# Patient Record
Sex: Female | Born: 1960
Health system: Southern US, Community
[De-identification: ages and names within clinical notes are randomized; demographics above are authoritative.]

## PROBLEM LIST (undated history)

## (undated) DIAGNOSIS — M545 Low back pain: Secondary | ICD-10-CM

## (undated) DIAGNOSIS — I10 Essential (primary) hypertension: Secondary | ICD-10-CM

## (undated) DIAGNOSIS — E785 Hyperlipidemia, unspecified: Secondary | ICD-10-CM

## (undated) DIAGNOSIS — N393 Stress incontinence (female) (male): Secondary | ICD-10-CM

## (undated) DIAGNOSIS — F32A Depression, unspecified: Secondary | ICD-10-CM

## (undated) DIAGNOSIS — M62838 Other muscle spasm: Secondary | ICD-10-CM

## (undated) DIAGNOSIS — Z720 Tobacco use: Secondary | ICD-10-CM

## (undated) DIAGNOSIS — E782 Mixed hyperlipidemia: Secondary | ICD-10-CM

## (undated) DIAGNOSIS — R0600 Dyspnea, unspecified: Secondary | ICD-10-CM

## (undated) DIAGNOSIS — M109 Gout, unspecified: Secondary | ICD-10-CM

## (undated) DIAGNOSIS — M1990 Unspecified osteoarthritis, unspecified site: Secondary | ICD-10-CM

## (undated) DIAGNOSIS — J439 Emphysema, unspecified: Secondary | ICD-10-CM

## (undated) DIAGNOSIS — G8929 Other chronic pain: Secondary | ICD-10-CM

## (undated) DIAGNOSIS — C50919 Malignant neoplasm of unspecified site of unspecified female breast: Secondary | ICD-10-CM

## (undated) DIAGNOSIS — J449 Chronic obstructive pulmonary disease, unspecified: Secondary | ICD-10-CM

## (undated) DIAGNOSIS — R531 Weakness: Secondary | ICD-10-CM

## (undated) DIAGNOSIS — F329 Major depressive disorder, single episode, unspecified: Secondary | ICD-10-CM

## (undated) DIAGNOSIS — C4359 Malignant melanoma of other part of trunk: Secondary | ICD-10-CM

## (undated) DIAGNOSIS — C55 Malignant neoplasm of uterus, part unspecified: Secondary | ICD-10-CM

## (undated) DIAGNOSIS — F419 Anxiety disorder, unspecified: Secondary | ICD-10-CM

## (undated) HISTORY — DX: Essential (primary) hypertension: I10

## (undated) HISTORY — DX: Mixed hyperlipidemia: E78.2

## (undated) HISTORY — PX: VAGINAL HYSTERECTOMY: SUR661

## (undated) HISTORY — DX: Other chronic pain: G89.29

## (undated) HISTORY — PX: MELANOMA EXCISION: SHX5266

## (undated) NOTE — *Deleted (*Deleted)
Patient Care Team: Margaret Rutherford, NP as PCP - General (Adult Health Nurse Practitioner)  DIAGNOSIS: No diagnosis found.  SUMMARY OF ONCOLOGIC HISTORY: Oncology History  Breast cancer of upper-outer quadrant of left female breast (HCC)  01/02/2017 Mammogram   Diagnostic mammogram and US  IMPRESSION: 1. Findings highly suspicious for malignancy in the upper outer quadrant of the left breast, measuring 1.8 x 2.1 x 1.6 cm. Medially adjacent to this mass is a 6 mm oval nearly anechoic nodule which could be a cyst or tumor nodule   2. A suspicious left axillary lymph node. 3. Probable fibroadenoma upper-outer quadrant right breast at 10 o'clock. 4. Probably benign calcifications in the right breast.   01/30/2017 Initial Biopsy   Breast, left, needle core biopsy, upper outer 2:00 9 cm fn - INVASIVE DUCTAL CARCINOMA, SEE COMMENT. - DUCTAL CARCINOMA IN SITU. Microscopic Comment The carcinoma appears grade 2.   01/30/2017 Receptors her2   ER 20%+, weak staining, PR-, HER2-, Ki67 15%   03/30/2017 Surgery   Left mastectomy:  IDC 3.2 cm, DCIS, margins negative, 3/18 lymph nodes positive with focal extranodal extension, grade 2, ER 20%, PR 0%, HER-2 negative ratio 1.13, Ki-67 15% stage IIB Right mastectomy: Complex sclerosing lesion with UDH, PASH, 0/5 LN Neg    04/24/2017 Pathology Results   Mammaprint testing: High risk, 10 year risk of recurrence untreated 29%; 5 year distant metastases free interval 94.6% with came on hormonal therapy, basal type    Chemotherapy   Refused chemo   06/11/2017 -  Anti-estrogen oral therapy   Anastrozole 1 mg daily     CHIEF COMPLIANT: Follow-up of left breast cancer on anastrozole therapy  INTERVAL HISTORY: Margaret James is a 24 y.o. with above-mentioned history of high risk left breast cancer treated with the bilateral mastectomies and is currently on anastrozole therapy. I last saw her two years ago. She presents to the clinic today for  follow-up.   ALLERGIES:  is allergic to lyrica [pregabalin].  MEDICATIONS:  Current Outpatient Medications  Medication Sig Dispense Refill  . albuterol (PROVENTIL HFA;VENTOLIN HFA) 108 (90 Base) MCG/ACT inhaler Inhale 2 puffs into the lungs every 6 (six) hours as needed for wheezing or shortness of breath.    . ALPRAZolam (XANAX) 0.5 MG tablet Take 0.5 mg by mouth daily as needed for anxiety.    Marland Kitchen amLODipine (NORVASC) 5 MG tablet Take 1 tablet (5 mg total) by mouth daily. 30 tablet 0  . anastrozole (ARIMIDEX) 1 MG tablet Take 1 tablet (1 mg total) by mouth daily. 90 tablet 0  . atorvastatin (LIPITOR) 20 MG tablet Take 1 tablet (20 mg total) by mouth daily.    Marland Kitchen buPROPion (WELLBUTRIN SR) 200 MG 12 hr tablet Take 200 mg by mouth daily.    . cloNIDine (CATAPRES) 0.1 MG tablet     . DULoxetine (CYMBALTA) 30 MG capsule Take 30 mg by mouth daily.    Marland Kitchen oxyCODONE-acetaminophen (PERCOCET) 10-325 MG tablet     . Tiotropium Bromide-Olodaterol (STIOLTO RESPIMAT) 2.5-2.5 MCG/ACT AERS Inhale 2 puffs into the lungs daily.    Marland Kitchen tiZANidine (ZANAFLEX) 4 MG tablet Take 4 mg by mouth every 8 (eight) hours as needed for muscle spasms.      No current facility-administered medications for this visit.    PHYSICAL EXAMINATION: ECOG PERFORMANCE STATUS: {CHL ONC ECOG PS:[id number]}  There were no vitals filed for this visit. There were no vitals filed for this visit.  BREAST:*** No palpable masses or nodules in either  right or left breasts. No palpable axillary supraclavicular or infraclavicular adenopathy no breast tenderness or nipple discharge. (exam performed in the presence of a chaperone)  LABORATORY DATA:  I have reviewed the data as listed CMP Latest Ref Rng & Units 01/01/2019 07/31/2018 09/11/2017  Glucose 70 - 99 mg/dL - 161(W) 960  BUN 6 - 20 mg/dL - 10 45.4  Creatinine 0.98 - 1.00 mg/dL 1.19 1.47 1.0  Sodium 829 - 145 mmol/L - 139 139  Potassium 3.5 - 5.1 mmol/L - 4.0 4.3  Chloride 98 - 111  mmol/L - 102 -  CO2 22 - 32 mmol/L - 26 26  Calcium 8.9 - 10.3 mg/dL - 9.6 56.2  Total Protein 6.4 - 8.3 g/dL - - 8.2  Total Bilirubin 0.20 - 1.20 mg/dL - - 1.30  Alkaline Phos 40 - 150 U/L - - 66  AST 5 - 34 U/L - - 15  ALT 0 - 55 U/L - - 18    Lab Results  Component Value Date   WBC 7.2 07/31/2018   HGB 14.5 07/31/2018   HCT 44.7 07/31/2018   MCV 94.7 07/31/2018   PLT 326 07/31/2018   NEUTROABS 4.1 09/11/2017    ASSESSMENT & PLAN:  No problem-specific Assessment & Plan notes found for this encounter.    No orders of the defined types were placed in this encounter.  The patient has a good understanding of the overall plan. she agrees with it. she will call with any problems that may develop before the next visit here.  Total time spent: *** mins including face to face time and time spent for planning, charting and coordination of care  Serena Croissant, MD 08/16/2020  I, Kirt Boys Dorshimer, am acting as scribe for Dr. Serena Croissant.  {insert scribe attestation}

---
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
## 2002-01-31 ENCOUNTER — Encounter: Payer: Self-pay | Admitting: Emergency Medicine

## 2002-01-31 ENCOUNTER — Encounter: Admission: RE | Admit: 2002-01-31 | Discharge: 2002-01-31 | Payer: Self-pay | Admitting: Emergency Medicine

## 2002-02-10 ENCOUNTER — Encounter: Payer: Self-pay | Admitting: Emergency Medicine

## 2002-02-10 ENCOUNTER — Encounter: Admission: RE | Admit: 2002-02-10 | Discharge: 2002-02-10 | Payer: Self-pay | Admitting: Emergency Medicine

## 2012-03-29 DIAGNOSIS — I2510 Atherosclerotic heart disease of native coronary artery without angina pectoris: Secondary | ICD-10-CM

## 2013-10-06 ENCOUNTER — Other Ambulatory Visit: Payer: Self-pay | Admitting: Sports Medicine

## 2013-10-06 DIAGNOSIS — IMO0002 Reserved for concepts with insufficient information to code with codable children: Secondary | ICD-10-CM

## 2013-10-16 ENCOUNTER — Ambulatory Visit
Admission: RE | Admit: 2013-10-16 | Discharge: 2013-10-16 | Disposition: A | Discharge status: Home / Self Care | Payer: 59 | Source: Ambulatory Visit | Attending: Sports Medicine | Admitting: Sports Medicine

## 2013-10-16 DIAGNOSIS — IMO0002 Reserved for concepts with insufficient information to code with codable children: Secondary | ICD-10-CM

## 2013-10-28 ENCOUNTER — Other Ambulatory Visit: Payer: Self-pay | Admitting: Pain Medicine

## 2013-10-29 ENCOUNTER — Other Ambulatory Visit: Payer: Self-pay | Admitting: Pain Medicine

## 2013-10-29 DIAGNOSIS — M8580 Other specified disorders of bone density and structure, unspecified site: Secondary | ICD-10-CM

## 2013-12-08 ENCOUNTER — Other Ambulatory Visit: Payer: 59

## 2013-12-31 ENCOUNTER — Other Ambulatory Visit: Payer: 59

## 2014-12-29 ENCOUNTER — Other Ambulatory Visit (HOSPITAL_COMMUNITY): Payer: Self-pay | Admitting: Respiratory Therapy

## 2014-12-29 DIAGNOSIS — R0602 Shortness of breath: Secondary | ICD-10-CM

## 2015-01-06 ENCOUNTER — Ambulatory Visit (HOSPITAL_COMMUNITY)
Admission: RE | Admit: 2015-01-06 | Discharge: 2015-01-06 | Disposition: A | Discharge status: Home / Self Care | Payer: 59 | Source: Ambulatory Visit | Attending: Internal Medicine | Admitting: Internal Medicine

## 2015-01-06 DIAGNOSIS — R0602 Shortness of breath: Secondary | ICD-10-CM | POA: Diagnosis present

## 2015-01-06 MED ORDER — ALBUTEROL SULFATE (2.5 MG/3ML) 0.083% IN NEBU
2.5000 mg | INHALATION_SOLUTION | Freq: Once | RESPIRATORY_TRACT | Status: AC
  Administered 2015-01-06: 2.5 mg via RESPIRATORY_TRACT

## 2015-01-12 LAB — PULMONARY FUNCTION TEST
DL/VA % pred: 51 %
DL/VA: 2.56 ml/min/mmHg/L
DLCO cor % pred: 32 %
DLCO cor: 8.4 ml/min/mmHg
DLCO unc % pred: 32 %
DLCO unc: 8.4 ml/min/mmHg
FEF 25-75 Post: 0.8 L/s
FEF 25-75 Pre: 0.83 L/s
FEF2575-%Change-Post: -3 %
FEF2575-%Pred-Post: 30 %
FEF2575-%Pred-Pre: 31 %
FEV1-%Change-Post: -1 %
FEV1-%Pred-Post: 63 %
FEV1-%Pred-Pre: 65 %
FEV1-Post: 1.79 L
FEV1-Pre: 1.82 L
FEV1FVC-%Change-Post: 0 %
FEV1FVC-%Pred-Pre: 77 %
FEV6-%Change-Post: 0 %
FEV6-%Pred-Post: 83 %
FEV6-%Pred-Pre: 83 %
FEV6-Post: 2.88 L
FEV6-Pre: 2.88 L
FEV6FVC-%Change-Post: 1 %
FEV6FVC-%Pred-Post: 101 %
FEV6FVC-%Pred-Pre: 100 %
FVC-%Change-Post: -1 %
FVC-%Pred-Post: 82 %
FVC-%Pred-Pre: 83 %
FVC-Post: 2.93 L
FVC-Pre: 2.96 L
Post FEV1/FVC ratio: 61 %
Post FEV6/FVC ratio: 98 %
Pre FEV1/FVC ratio: 61 %
Pre FEV6/FVC Ratio: 97 %
RV % pred: 86 %
RV: 1.67 L
TLC % pred: 74 %
TLC: 3.88 L

## 2015-08-10 ENCOUNTER — Other Ambulatory Visit (HOSPITAL_COMMUNITY): Payer: Self-pay | Admitting: Internal Medicine

## 2015-08-10 DIAGNOSIS — N63 Unspecified lump in unspecified breast: Secondary | ICD-10-CM

## 2015-08-24 ENCOUNTER — Inpatient Hospital Stay (HOSPITAL_COMMUNITY): Admission: RE | Admit: 2015-08-24 | Payer: 59 | Source: Ambulatory Visit

## 2015-09-13 ENCOUNTER — Other Ambulatory Visit (HOSPITAL_COMMUNITY): Payer: Self-pay | Admitting: Internal Medicine

## 2015-09-13 DIAGNOSIS — R222 Localized swelling, mass and lump, trunk: Secondary | ICD-10-CM

## 2015-09-15 ENCOUNTER — Ambulatory Visit (HOSPITAL_COMMUNITY)
Admission: RE | Admit: 2015-09-15 | Discharge: 2015-09-15 | Disposition: A | Discharge status: Home / Self Care | Payer: 59 | Source: Ambulatory Visit | Attending: Internal Medicine | Admitting: Internal Medicine

## 2015-09-15 DIAGNOSIS — R222 Localized swelling, mass and lump, trunk: Secondary | ICD-10-CM | POA: Diagnosis not present

## 2015-09-15 DIAGNOSIS — N6489 Other specified disorders of breast: Secondary | ICD-10-CM | POA: Diagnosis not present

## 2015-09-15 MED ORDER — IOHEXOL 300 MG/ML  SOLN
100.0000 mL | Freq: Once | INTRAMUSCULAR | Status: AC | PRN
  Administered 2015-09-15: 75 mL via INTRAVENOUS

## 2015-09-28 ENCOUNTER — Ambulatory Visit (HOSPITAL_COMMUNITY): Payer: 59 | Attending: Sports Medicine | Admitting: Physical Therapy

## 2015-09-28 DIAGNOSIS — Z7409 Other reduced mobility: Secondary | ICD-10-CM

## 2015-09-28 DIAGNOSIS — R6889 Other general symptoms and signs: Secondary | ICD-10-CM | POA: Insufficient documentation

## 2015-09-28 DIAGNOSIS — R29898 Other symptoms and signs involving the musculoskeletal system: Secondary | ICD-10-CM | POA: Insufficient documentation

## 2015-09-28 DIAGNOSIS — M5440 Lumbago with sciatica, unspecified side: Secondary | ICD-10-CM | POA: Diagnosis not present

## 2015-09-28 DIAGNOSIS — M623 Immobility syndrome (paraplegic): Secondary | ICD-10-CM | POA: Insufficient documentation

## 2015-09-28 DIAGNOSIS — M2560 Stiffness of unspecified joint, not elsewhere classified: Secondary | ICD-10-CM

## 2015-09-28 NOTE — Therapy (Signed)
[place], [place], [address] Phone: [phone]   Fax:  [phone]  Physical Therapy Evaluation  Patient Details  Name: Margaret James MRN: [id number] Date of Birth: 12/02/1961 Referring Provider: Dr. Delilah Shan  Encounter Date: 09/28/2015      PT End of Session - 09/28/15 1148    Visit Number 1   Number of Visits 8   Date for PT Re-Evaluation 10/28/15   Authorization Type [facility]   Authorization - Visit Number 1   Authorization - Number of Visits 10   PT Start Time 3532   PT Stop Time 1150   PT Time Calculation (min) 45 min   Activity Tolerance Patient tolerated treatment well   Behavior During Therapy [facility] for tasks assessed/performed      No past medical history on file.  No past surgical history on file.  There were no vitals filed for this visit.  Visit Diagnosis:  Midline low back pain with sciatica, sciatica laterality unspecified  Activity intolerance  Leg weakness, bilateral  Stiffness due to immobility      Subjective Assessment - 09/28/15 1110    Subjective Ms. Wilborn has been having back pain for three years.  At this time she fell over a box and landed on her lower back.  She has been diagnosed with nerve damage.  She states that her pain is constant unless she takes medication.  The pain is bilateral and goes down both legs to the knee level.   She states she is limited in activity due to her COPD.    Pertinent History COPD,    How long can you sit comfortably? an hour    How long can you stand comfortably? fifteen minutes.    How long can you walk comfortably? walking hurts her knees more than her back and she is limited by her COPD    Patient Stated Goals Have less pain and be able to lift more    Currently in Pain? Yes   Pain Score 6    Pain Location Back   Pain Orientation Lower   Pain Descriptors / Indicators Aching   Pain Radiating Towards to knee level    Multiple Pain  Sites Yes   Pain Score 5   Pain Location Knee   Pain Orientation Right;Left   Pain Descriptors / Indicators Aching   Pain Type Chronic pain   Pain Onset More than a month ago   Pain Frequency Intermittent   Aggravating Factors  walking             [facility] PT Assessment - 09/28/15 0001    Assessment   Medical Diagnosis Low back pain   Referring Provider Dr. Delilah Shan   Onset Date/Surgical Date 09/15/15   Next MD Visit 10/27/2015   Prior Therapy none    Precautions   Precautions None   Restrictions   Weight Bearing Restrictions No   Balance Screen   Has the patient fallen in the past 6 months No   Has the patient had a decrease in activity level because of a fear of falling?  No   Is the patient reluctant to leave their home because of a fear of falling?  No   Home Environment   Living Environment Private residence   Prior Function   Level of Independence Independent with household mobility with device  uses cane due to knees not her back    Vocation Unemployed   Leisure watches grand child  Cognition   Overall Cognitive Status Within Functional Limits for tasks assessed   Observation/Other Assessments   Focus on Therapeutic Outcomes (FOTO)  37   Functional Tests   Functional tests Single leg stance;Sit to Stand   Single Leg Stance   Comments Lt 20" , RT 4 seconds    Sit to Stand   Comments able to complete 5 in 49.43   ROM / Strength   AROM / PROM / Strength AROM;Strength   AROM   AROM Assessment Site Lumbar   Lumbar Flexion 70 going down increases pain more than up    Lumbar Extension 40 reps no change   Strength   Strength Assessment Site Hip;Knee;Ankle   Right/Left Hip Right;Left   Right Hip Flexion 3-/5   Right Hip Extension 3/5   Right Hip ABduction 3+/5   Left Hip Flexion 3/5   Left Hip Extension 3/5   Left Hip ABduction 3-/5   Right/Left Knee Right;Left   Right Knee Flexion 3/5   Right Knee Extension 4/5   Left Knee Flexion 3/5   Left Knee  Extension 3+/5   Right/Left Ankle Right;Left   Right Ankle Dorsiflexion 4+/5   Left Ankle Dorsiflexion 4+/5                   [facility] Adult PT Treatment/Exercise - 09/28/15 0001    Exercises   Exercises Lumbar   Lumbar Exercises: Stretches   Active Hamstring Stretch 2 reps;30 seconds   Active Hamstring Stretch Limitations supine   Single Knee to Chest Stretch 2 reps;30 seconds   Lumbar Exercises: Supine   Ab Set 10 reps   Glut Set 10 reps   Other Supine Lumbar Exercises hip adduction isometric x 10                  PT Short Term Goals - 09/28/15 1156    PT SHORT TERM GOAL #1   Title I HEP   Time 1   Period Weeks   PT SHORT TERM GOAL #2   Title Pain level to decrease to no greater than 5/10   Time 2   Period Weeks   PT SHORT TERM GOAL #3   Title Pt strength to be improved 1/2 grade to allow pt to stand for 10 minutes without increased pain.   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 Hogston is a 54 yo female who has had LBP x 3 yrs with acute exacerbation in the past several months.  The patient has been referred to physical therapy to  attempt to decrease her pain and improve her functional tolerance.  Examination demonstrates decreased ROM, decreased balance, decreasd core and LE strength, decrased activity tolerance and increased pain.  Ms. Mallinger will benefit from skilled PT to address these issues improve her functional mobility and her quality of life.    Pt will benefit from skilled therapeutic intervention in order to improve on the following deficits Abnormal gait;Decreased activity tolerance;Decreased balance;Decreased range of motion;Decreased strength;Difficulty walking;Impaired flexibility;Pain   Rehab Potential Good   PT Frequency 2x / week   PT Duration 4 weeks   PT Treatment/Interventions ADLs/Self Care Home Management;Functional mobility training;Therapeutic activities;Therapeutic exercise;Balance training;Patient/family education;Manual techniques   PT Next Visit Plan Pt will benefit from core strengthening begin bent knee lift, bridge, clam and isometric hip flexion; progress to hip excursion, heel raises and functional squats.    PT Home Exercise Plan given    Consulted and Agree with Plan of Care Patient          G-Codes - October 11, 2015 1330    Functional Assessment Tool Used foto   Functional Limitation Other PT primary   Other PT Primary Current Status (H6808) At least 60 percent but less than 80 percent impaired, limited or restricted   Other PT Primary Goal Status (U1103) At least 40 percent but less than 60 percent impaired, limited or restricted       Problem List There are no active problems to display for this patient.   Rayetta Humphrey, PT CLT [phone] 2015-10-11, 1:35 PM  [place] [address] [place], [place], [address] Phone: [phone]   Fax:  [phone]  Name: Margaret James MRN: [id number] Date of Birth: [birth year]

## 2015-09-28 NOTE — Patient Instructions (Signed)
Isometric Abdominal    Lying on back with knees bent, tighten stomach by pressing elbows down. Hold _5___ seconds. Repeat ___5-10_ times per set. Do _1___ sets per session. Do __5__ sessions per day.  http://orth.exer.us/1086   Copyright  VHI. All rights reserved.  Isometric Gluteals    Tighten buttock muscles. Repeat __5-10__ times per set. Do _1___ sets per session. Do _5___ sessions per day.  http://orth.exer.us/1126   Copyright  VHI. All rights reserved.  Strengthening: Hip Adduction - Isometric    With ball or folded pillow between knees, squeeze knees together. Hold __3-5__ seconds. Repeat __5-10__ times per set. Do ___1_ sets per session. Do _5___ sessions per day.  http://orth.exer.us/612   Copyright  VHI. All rights reserved.  Hamstring Stretch: Active    Support behind right knee. Starting with knee bent, attempt to straighten knee until a comfortable stretch is felt in back of thigh. Hold _30___ seconds. Repeat _3___ times per set. Do ___1_ sets per session. Do __2-3__ sessions per day.  http://orth.exer.us/158   Copyright  VHI. All rights reserved.  Knee-to-Chest Stretch: Unilateral    With hand behind right knee, pull knee in to chest until a comfortable stretch is felt in lower back and buttocks. Keep back relaxed. Hold _30___ seconds. Repeat _2-3___ times per set. Do _1___ sets per session. Do _2___ sessions per day.  http://orth.exer.us/126   Copyright  VHI. All rights reserved.

## 2015-09-30 ENCOUNTER — Ambulatory Visit (HOSPITAL_COMMUNITY): Payer: 59 | Admitting: Physical Therapy

## 2015-10-05 ENCOUNTER — Ambulatory Visit (HOSPITAL_COMMUNITY): Payer: 59 | Admitting: Physical Therapy

## 2015-10-05 DIAGNOSIS — M2560 Stiffness of unspecified joint, not elsewhere classified: Secondary | ICD-10-CM

## 2015-10-05 DIAGNOSIS — R29898 Other symptoms and signs involving the musculoskeletal system: Secondary | ICD-10-CM

## 2015-10-05 DIAGNOSIS — M5440 Lumbago with sciatica, unspecified side: Secondary | ICD-10-CM | POA: Diagnosis not present

## 2015-10-05 DIAGNOSIS — Z7409 Other reduced mobility: Secondary | ICD-10-CM

## 2015-10-05 DIAGNOSIS — R6889 Other general symptoms and signs: Secondary | ICD-10-CM

## 2015-10-05 NOTE — Therapy (Addendum)
[place] [address] [place], [place], [address] Phone: [phone]   Fax:  [phone]  Physical Therapy Treatment  Patient Details  Name: Margaret James MRN: [id number] Date of Birth: [birth year] Referring Provider: Dr. Delilah Shan  Encounter Date: 10/05/2015      PT End of Session - 10/05/15 1100    Visit Number 2   Number of Visits 8   Date for PT Re-Evaluation 10/28/15   Authorization Type [facility]   Authorization - Visit Number 2   Authorization - Number of Visits 8   PT Start Time 9563 Pt late for appointment time.    PT Stop Time 1105   PT Time Calculation (min) 35 min      No past medical history on file.  No past surgical history on file.  There were no vitals filed for this visit.  Visit Diagnosis:  Midline low back pain with sciatica, sciatica laterality unspecified  Activity intolerance  Leg weakness, bilateral  Stiffness due to immobility      Subjective Assessment - 10/05/15 1032    Subjective Margaret James has been having an acute exacerbation of her COPD and is SOB    Pertinent History COPD,    Currently in Pain? Yes   Pain Score 7    Pain Location Back   Pain Orientation Lower   Pain Descriptors / Indicators Aching               [facility] Adult PT Treatment/Exercise - 10/05/15 0001    Exercises   Exercises Lumbar   Lumbar Exercises: Standing   Heel Raises 10 reps   Functional Squats 10 reps   Other Standing Lumbar Exercises 3-D hip excursion x 3    Lumbar Exercises: Seated   Sit to Stand 5 reps   Other Seated Lumbar Exercises adduction squeeze; hip IR/ER isometrics using toe squeeze;heel squeeze x 10 , ab isometric x 10    Lumbar Exercises: Supine   Bridge 5 reps   Lumbar Exercises: Quadruped   Madcat/Old Horse 5 reps      HMP to low back at end of session            PT Education - 10/05/15 1059    Education provided Yes   Education Details use Heat for pain releif    Person(s) Educated Patient   Methods Explanation   Comprehension Verbalized understanding          PT Short Term Goals - 10/05/15 1101    PT SHORT TERM GOAL #1   Title I HEP   Period Weeks   Status On-going   PT SHORT TERM GOAL #2   Title Pain level to decrease to no greater than 5/10   Time 2   Period Weeks   Status On-going   PT SHORT TERM GOAL #3   Title Pt strength to be improved 1/2 grade to allow pt to stand for 10 minutes without increased pain.   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 Pt stopped therapist prior to 90/90 position( as sitting in chair).  Pt states she has to put her hands under her hips while supine due to increased pain. Treatment limited by pain.     Pt will benefit from skilled therapeutic intervention in order to improve on the following deficits Abnormal gait;Decreased activity tolerance;Decreased balance;Decreased range of motion;Decreased strength;Difficulty walking;Impaired flexibility;Pain   PT Frequency 2x / week   PT Duration 4 weeks   PT  Treatment/Interventions ADLs/Self Care Home Management;Functional mobility training;Therapeutic activities;Therapeutic exercise;Balance training;Patient/family education;Manual techniques   PT Next Visit Plan continue to progress exercises as able.          Problem List There are no active problems to display for this patient.   Rayetta Humphrey, PT CLT [phone] 10/05/2015, 11:06 AM  [place] [address] [place], [place], [address] Phone: [phone]   Fax:  [phone]  Name: Margaret James MRN: [id number] Date of Birth: June 07, 1961    PHYSICAL THERAPY DISCHARGE SUMMARY  Visits from Start of Care: 2  Current functional level related to goals / functional outcomes: sane   Remaining deficits: same   Education / Equipment: HEP  Plan: Patient agrees to discharge.  Patient goals were not met. Patient is being discharged due to not returning since the last visit.  ?????     \  Rayetta Humphrey, PT CLT [phone]

## 2015-10-07 ENCOUNTER — Ambulatory Visit (HOSPITAL_COMMUNITY): Payer: 59 | Admitting: Physical Therapy

## 2015-10-12 ENCOUNTER — Ambulatory Visit (HOSPITAL_COMMUNITY): Payer: 59 | Admitting: Physical Therapy

## 2015-10-12 ENCOUNTER — Telehealth (HOSPITAL_COMMUNITY): Payer: Self-pay

## 2015-10-12 NOTE — Telephone Encounter (Signed)
No transportation °

## 2015-10-14 ENCOUNTER — Ambulatory Visit (HOSPITAL_COMMUNITY): Payer: 59 | Attending: Sports Medicine | Admitting: Physical Therapy

## 2015-10-14 ENCOUNTER — Telehealth (HOSPITAL_COMMUNITY): Payer: Self-pay | Admitting: Physical Therapy

## 2015-10-14 DIAGNOSIS — M623 Immobility syndrome (paraplegic): Secondary | ICD-10-CM | POA: Insufficient documentation

## 2015-10-14 DIAGNOSIS — R29898 Other symptoms and signs involving the musculoskeletal system: Secondary | ICD-10-CM | POA: Insufficient documentation

## 2015-10-14 DIAGNOSIS — M5440 Lumbago with sciatica, unspecified side: Secondary | ICD-10-CM | POA: Insufficient documentation

## 2015-10-14 DIAGNOSIS — R6889 Other general symptoms and signs: Secondary | ICD-10-CM | POA: Insufficient documentation

## 2015-10-14 NOTE — Telephone Encounter (Signed)
Left message on VM regarding missed appointment.  This is patients second NS

## 2015-10-19 ENCOUNTER — Ambulatory Visit (HOSPITAL_COMMUNITY): Payer: 59

## 2015-10-19 DIAGNOSIS — R6889 Other general symptoms and signs: Secondary | ICD-10-CM

## 2015-10-19 DIAGNOSIS — M623 Immobility syndrome (paraplegic): Secondary | ICD-10-CM | POA: Diagnosis present

## 2015-10-19 DIAGNOSIS — M5440 Lumbago with sciatica, unspecified side: Secondary | ICD-10-CM

## 2015-10-19 DIAGNOSIS — R29898 Other symptoms and signs involving the musculoskeletal system: Secondary | ICD-10-CM | POA: Diagnosis present

## 2015-10-19 DIAGNOSIS — M2560 Stiffness of unspecified joint, not elsewhere classified: Secondary | ICD-10-CM

## 2015-10-19 DIAGNOSIS — Z7409 Other reduced mobility: Secondary | ICD-10-CM

## 2015-10-19 NOTE — Therapy (Addendum)
[place], [place], [address] Phone: [phone]   Fax:  [phone]  Physical Therapy Treatment  Patient Details  Name: Margaret James MRN: [id number] Date of Birth: January 26, 1961 Referring Provider: Dr. Delilah Shan  Encounter Date: 10/19/2015      PT End of Session - 10/19/15 1157    Visit Number 3   Number of Visits 8   Date for PT Re-Evaluation 10/28/15   Authorization Type [facility]   Authorization - Visit Number 3   Authorization - Number of Visits 8   PT Start Time 1104   PT Stop Time 1150   PT Time Calculation (min) 46 min   Activity Tolerance Patient tolerated treatment well   Behavior During Therapy [facility] for tasks assessed/performed      No past medical history on file.  No past surgical history on file.  There were no vitals filed for this visit.  Visit Diagnosis:  Midline low back pain with sciatica, sciatica laterality unspecified  Activity intolerance  Leg weakness, bilateral  Stiffness due to immobility      Subjective Assessment - 10/19/15 1100    Subjective Pt stated her back pain scale is the same today 7/10, pain medication taken prior session today.   Pain Score 7    Pain Location Back   Pain Orientation Lower   Pain Descriptors / Indicators Nagging                         [facility] Adult PT Treatment/Exercise - 10/19/15 0001    Exercises   Exercises Lumbar   Lumbar Exercises: Stretches   Pelvic Tilt 5 reps;10 seconds   Pelvic Tilt Limitations posterior pelvic tilt with ab set   Prone on Elbows Stretch 2 reps;20 seconds   Lumbar Exercises: Standing   Heel Raises 10 reps   Functional Squats 10 reps   Other Standing Lumbar Exercises 3-D hip excursion x 10   Lumbar Exercises: Seated   Sit to Stand 5 reps   Other Seated Lumbar Exercises adduction squeeze; hip IR/ER isometrics using toe squeeze;heel squeeze x 10 , ab isometric x 10; posterior pelvic tilt with ab set  x 10   Lumbar Exercises: Supine   Glut Set 10 reps;5 seconds   Bridge 10 reps   Bridge Limitations semirecumbent position with hands under hips    Lumbar Exercises: Quadruped   Madcat/Old Horse 5 reps   Straight Leg Raise 5 reps;5 seconds   Straight Leg Raises Limitations heel squeeze   Modalities   Modalities Moist Heat   Moist Heat Therapy   Number Minutes Moist Heat 10 Minutes   Moist Heat Location Lumbar Spine  During seated exercises at EOS                  PT Short Term Goals - 10/05/15 1101    PT SHORT TERM GOAL #1   Title I HEP   Period Weeks   Status On-going   PT SHORT TERM GOAL #2   Title Pain level to decrease to no greater than 5/10   Time 2   Period Weeks   Status On-going   PT SHORT TERM GOAL #3   Title Pt strength to be improved 1/2 grade to allow pt to stand for 10 minutes without increased pain.   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  Session focus  on improving core and gluteal strengthening as well as awareness of posure.  Verbal and tactile cueing required to improve core activation due to weakness.  Pt was able to tolerate prone heel squeeze and minimal cueing required for form with squats this session.  Ended session with MHP to lower back during seated strengtheniung exercises.  Pt stated pain reduced at end of session.   PT Next Visit Plan continue to progress exercises as able.          Problem List There are no active problems to display for this patient.  [address] LPTA; [place]   Aldona Lento 10/19/2015, 12:03 PM  [place], [place], [address] Phone: [phone]   Fax:  [phone]  Name: Margaret James MRN: [id number] Date of Birth: 04/19/1961    PHYSICAL THERAPY DISCHARGE SUMMARY 02/01/2016 Visits from Start of Care: 3  Current functional level related to goals / functional outcomes: As above Remaining deficits: Pain mobility   Education / Equipment: HEP  Plan: Patient agrees to discharge.  Patient goals were not met. Patient is being discharged due to not returning since the last visit.  ?????  Rayetta Humphrey, [place] CLT [phone]

## 2015-10-21 ENCOUNTER — Telehealth (HOSPITAL_COMMUNITY): Payer: Self-pay | Admitting: Physical Therapy

## 2015-10-21 ENCOUNTER — Ambulatory Visit (HOSPITAL_COMMUNITY): Payer: 59 | Admitting: Physical Therapy

## 2015-10-21 NOTE — Telephone Encounter (Signed)
Pt NS for appointment.  Contacted patient who though today was Wednesday, not Thursday.  Pt reminded of next appointment

## 2015-10-26 ENCOUNTER — Ambulatory Visit (HOSPITAL_COMMUNITY): Payer: 59 | Admitting: Physical Therapy

## 2016-09-28 ENCOUNTER — Encounter: Payer: Self-pay | Admitting: Obstetrics and Gynecology

## 2016-10-23 ENCOUNTER — Encounter: Payer: Self-pay | Admitting: Obstetrics and Gynecology

## 2016-11-06 ENCOUNTER — Encounter: Payer: Self-pay | Admitting: Obstetrics and Gynecology

## 2016-12-08 ENCOUNTER — Ambulatory Visit (HOSPITAL_COMMUNITY)
Admission: RE | Admit: 2016-12-08 | Discharge: 2016-12-08 | Disposition: A | Discharge status: Home / Self Care | Payer: Managed Care, Other (non HMO) | Source: Ambulatory Visit | Attending: Pulmonary Disease | Admitting: Pulmonary Disease

## 2016-12-08 ENCOUNTER — Other Ambulatory Visit (HOSPITAL_COMMUNITY): Payer: Self-pay | Admitting: Internal Medicine

## 2016-12-08 ENCOUNTER — Other Ambulatory Visit (HOSPITAL_COMMUNITY): Payer: Self-pay | Admitting: Pulmonary Disease

## 2016-12-08 DIAGNOSIS — R05 Cough: Secondary | ICD-10-CM

## 2016-12-08 DIAGNOSIS — R059 Cough, unspecified: Secondary | ICD-10-CM

## 2016-12-08 DIAGNOSIS — R0602 Shortness of breath: Secondary | ICD-10-CM

## 2016-12-11 HISTORY — PX: BREAST BIOPSY: SHX20

## 2016-12-12 ENCOUNTER — Other Ambulatory Visit (HOSPITAL_COMMUNITY): Payer: Self-pay | Admitting: Internal Medicine

## 2016-12-12 DIAGNOSIS — N6320 Unspecified lump in the left breast, unspecified quadrant: Secondary | ICD-10-CM

## 2016-12-18 ENCOUNTER — Other Ambulatory Visit (HOSPITAL_COMMUNITY): Payer: Self-pay | Admitting: Internal Medicine

## 2016-12-18 DIAGNOSIS — N6320 Unspecified lump in the left breast, unspecified quadrant: Secondary | ICD-10-CM

## 2016-12-19 ENCOUNTER — Encounter (HOSPITAL_COMMUNITY): Payer: Self-pay

## 2017-01-02 ENCOUNTER — Other Ambulatory Visit (HOSPITAL_COMMUNITY): Payer: Self-pay | Admitting: Internal Medicine

## 2017-01-02 ENCOUNTER — Ambulatory Visit (HOSPITAL_COMMUNITY)
Admission: RE | Admit: 2017-01-02 | Discharge: 2017-01-02 | Disposition: A | Discharge status: Home / Self Care | Payer: Managed Care, Other (non HMO) | Source: Ambulatory Visit | Attending: Internal Medicine | Admitting: Internal Medicine

## 2017-01-02 DIAGNOSIS — N6320 Unspecified lump in the left breast, unspecified quadrant: Secondary | ICD-10-CM

## 2017-01-02 DIAGNOSIS — N6489 Other specified disorders of breast: Secondary | ICD-10-CM | POA: Diagnosis not present

## 2017-01-02 DIAGNOSIS — N6321 Unspecified lump in the left breast, upper outer quadrant: Secondary | ICD-10-CM | POA: Diagnosis present

## 2017-01-30 ENCOUNTER — Other Ambulatory Visit (HOSPITAL_COMMUNITY): Payer: Self-pay | Admitting: Internal Medicine

## 2017-01-30 ENCOUNTER — Ambulatory Visit (HOSPITAL_COMMUNITY)
Admission: RE | Admit: 2017-01-30 | Discharge: 2017-01-30 | Disposition: A | Discharge status: Home / Self Care | Payer: Managed Care, Other (non HMO) | Source: Ambulatory Visit | Attending: Internal Medicine | Admitting: Internal Medicine

## 2017-01-30 DIAGNOSIS — R921 Mammographic calcification found on diagnostic imaging of breast: Secondary | ICD-10-CM

## 2017-01-30 DIAGNOSIS — N6320 Unspecified lump in the left breast, unspecified quadrant: Secondary | ICD-10-CM | POA: Diagnosis present

## 2017-01-30 DIAGNOSIS — D0512 Intraductal carcinoma in situ of left breast: Secondary | ICD-10-CM | POA: Diagnosis not present

## 2017-01-30 DIAGNOSIS — Z09 Encounter for follow-up examination after completed treatment for conditions other than malignant neoplasm: Secondary | ICD-10-CM

## 2017-01-30 MED ORDER — LIDOCAINE-EPINEPHRINE (PF) 1 %-1:200000 IJ SOLN
INTRAMUSCULAR | Status: AC
  Filled 2017-01-30: qty 30

## 2017-01-30 MED ORDER — LIDOCAINE HCL (PF) 1 % IJ SOLN
INTRAMUSCULAR | Status: AC
  Filled 2017-01-30: qty 10

## 2017-02-26 ENCOUNTER — Other Ambulatory Visit (HOSPITAL_COMMUNITY): Payer: Self-pay | Admitting: Internal Medicine

## 2017-02-26 ENCOUNTER — Other Ambulatory Visit: Payer: Self-pay | Admitting: General Surgery

## 2017-02-26 DIAGNOSIS — N6320 Unspecified lump in the left breast, unspecified quadrant: Secondary | ICD-10-CM

## 2017-02-26 DIAGNOSIS — C50412 Malignant neoplasm of upper-outer quadrant of left female breast: Secondary | ICD-10-CM

## 2017-03-01 ENCOUNTER — Other Ambulatory Visit: Payer: 59

## 2017-03-05 ENCOUNTER — Other Ambulatory Visit: Payer: 59

## 2017-03-05 ENCOUNTER — Observation Stay (HOSPITAL_COMMUNITY)
Admission: EM | Admit: 2017-03-05 | Discharge: 2017-03-05 | Disposition: A | Discharge status: Home / Self Care | Payer: Managed Care, Other (non HMO) | Attending: Internal Medicine | Admitting: Internal Medicine

## 2017-03-05 ENCOUNTER — Observation Stay (HOSPITAL_BASED_OUTPATIENT_CLINIC_OR_DEPARTMENT_OTHER): Payer: Managed Care, Other (non HMO)

## 2017-03-05 ENCOUNTER — Encounter (HOSPITAL_COMMUNITY): Payer: Self-pay | Admitting: Emergency Medicine

## 2017-03-05 ENCOUNTER — Other Ambulatory Visit (HOSPITAL_COMMUNITY): Payer: 59

## 2017-03-05 ENCOUNTER — Emergency Department (HOSPITAL_COMMUNITY): Payer: Managed Care, Other (non HMO)

## 2017-03-05 DIAGNOSIS — R071 Chest pain on breathing: Secondary | ICD-10-CM | POA: Diagnosis not present

## 2017-03-05 DIAGNOSIS — I10 Essential (primary) hypertension: Secondary | ICD-10-CM | POA: Insufficient documentation

## 2017-03-05 DIAGNOSIS — Z79891 Long term (current) use of opiate analgesic: Secondary | ICD-10-CM | POA: Diagnosis not present

## 2017-03-05 DIAGNOSIS — C50412 Malignant neoplasm of upper-outer quadrant of left female breast: Secondary | ICD-10-CM | POA: Diagnosis present

## 2017-03-05 DIAGNOSIS — Z79899 Other long term (current) drug therapy: Secondary | ICD-10-CM | POA: Insufficient documentation

## 2017-03-05 DIAGNOSIS — D0512 Intraductal carcinoma in situ of left breast: Secondary | ICD-10-CM | POA: Insufficient documentation

## 2017-03-05 DIAGNOSIS — Z720 Tobacco use: Secondary | ICD-10-CM | POA: Diagnosis not present

## 2017-03-05 DIAGNOSIS — I249 Acute ischemic heart disease, unspecified: Secondary | ICD-10-CM

## 2017-03-05 DIAGNOSIS — F329 Major depressive disorder, single episode, unspecified: Secondary | ICD-10-CM | POA: Diagnosis not present

## 2017-03-05 DIAGNOSIS — C50919 Malignant neoplasm of unspecified site of unspecified female breast: Secondary | ICD-10-CM

## 2017-03-05 DIAGNOSIS — R079 Chest pain, unspecified: Secondary | ICD-10-CM

## 2017-03-05 DIAGNOSIS — E785 Hyperlipidemia, unspecified: Secondary | ICD-10-CM | POA: Diagnosis not present

## 2017-03-05 DIAGNOSIS — J449 Chronic obstructive pulmonary disease, unspecified: Secondary | ICD-10-CM | POA: Diagnosis not present

## 2017-03-05 DIAGNOSIS — F17210 Nicotine dependence, cigarettes, uncomplicated: Secondary | ICD-10-CM | POA: Diagnosis not present

## 2017-03-05 DIAGNOSIS — Z8249 Family history of ischemic heart disease and other diseases of the circulatory system: Secondary | ICD-10-CM | POA: Insufficient documentation

## 2017-03-05 DIAGNOSIS — F32A Depression, unspecified: Secondary | ICD-10-CM | POA: Diagnosis present

## 2017-03-05 DIAGNOSIS — J439 Emphysema, unspecified: Secondary | ICD-10-CM

## 2017-03-05 DIAGNOSIS — Z8542 Personal history of malignant neoplasm of other parts of uterus: Secondary | ICD-10-CM | POA: Insufficient documentation

## 2017-03-05 HISTORY — DX: Depression, unspecified: F32.A

## 2017-03-05 HISTORY — DX: Major depressive disorder, single episode, unspecified: F32.9

## 2017-03-05 HISTORY — DX: Chronic obstructive pulmonary disease, unspecified: J44.9

## 2017-03-05 HISTORY — DX: Malignant neoplasm of unspecified site of unspecified female breast: C50.919

## 2017-03-05 HISTORY — DX: Hyperlipidemia, unspecified: E78.5

## 2017-03-05 HISTORY — DX: Tobacco use: Z72.0

## 2017-03-05 LAB — NM MYOCAR MULTI W/SPECT W/WALL MOTION / EF
Exercise duration (min): 5 min
MPHR: 164 {beats}/min
Peak HR: 130 {beats}/min
Percent HR: 79 %
Rest HR: 56 {beats}/min

## 2017-03-05 LAB — BASIC METABOLIC PANEL
Anion gap: 10 (ref 5–15)
BUN: 9 mg/dL (ref 6–20)
CO2: 26 mmol/L (ref 22–32)
Calcium: 9.1 mg/dL (ref 8.9–10.3)
Chloride: 101 mmol/L (ref 101–111)
Creatinine, Ser: 0.87 mg/dL (ref 0.44–1.00)
GFR calc Af Amer: >60 mL/min (ref >60)
GFR calc non Af Amer: >60 mL/min (ref >60)
Glucose, Bld: 110 mg/dL — ABNORMAL HIGH (ref 65–99)
Potassium: 4.5 mmol/L (ref 3.5–5.1)
Sodium: 137 mmol/L (ref 135–145)

## 2017-03-05 LAB — CBC WITH DIFFERENTIAL/PLATELET
Basophils Absolute: 0 10*3/uL (ref 0.0–0.1)
Basophils Relative: 0 %
Eosinophils Absolute: 0.1 10*3/uL (ref 0.0–0.7)
Eosinophils Relative: 1 %
HCT: 44.7 % (ref 36.0–46.0)
Hemoglobin: 15.1 g/dL — ABNORMAL HIGH (ref 12.0–15.0)
Lymphocytes Relative: 29 %
Lymphs Abs: 2.2 10*3/uL (ref 0.7–4.0)
MCH: 31.5 pg (ref 26.0–34.0)
MCHC: 33.8 g/dL (ref 30.0–36.0)
MCV: 93.1 fL (ref 78.0–100.0)
Monocytes Absolute: 0.7 10*3/uL (ref 0.1–1.0)
Monocytes Relative: 9 %
Neutro Abs: 4.8 10*3/uL (ref 1.7–7.7)
Neutrophils Relative %: 61 %
Platelets: 270 10*3/uL (ref 150–400)
RBC: 4.8 MIL/uL (ref 3.87–5.11)
RDW: 13.3 % (ref 11.5–15.5)
WBC: 7.8 10*3/uL (ref 4.0–10.5)

## 2017-03-05 LAB — I-STAT TROPONIN, ED: Troponin i, poc: 0 ng/mL (ref 0.00–0.08)

## 2017-03-05 LAB — LIPID PANEL
Cholesterol: 196 mg/dL (ref 0–200)
HDL: 39 mg/dL — ABNORMAL LOW (ref >40)
LDL Cholesterol: 108 mg/dL — ABNORMAL HIGH (ref 0–99)
Total CHOL/HDL Ratio: 5 RATIO
Triglycerides: 243 mg/dL — ABNORMAL HIGH (ref <150)
VLDL: 49 mg/dL — ABNORMAL HIGH (ref 0–40)

## 2017-03-05 LAB — ECHOCARDIOGRAM COMPLETE
Height: 66 in
Weight: 3070.4 oz

## 2017-03-05 LAB — RAPID URINE DRUG SCREEN, HOSP PERFORMED
Amphetamines: NOT DETECTED
Barbiturates: NOT DETECTED
Benzodiazepines: NOT DETECTED
Cocaine: NOT DETECTED
Opiates: POSITIVE — AB
Tetrahydrocannabinol: POSITIVE — AB

## 2017-03-05 LAB — D-DIMER, QUANTITATIVE: D-Dimer, Quant: 0.37 ug/mL-FEU (ref 0.00–0.50)

## 2017-03-05 LAB — TROPONIN I
Troponin I: <0.03 ng/mL (ref <0.03)
Troponin I: <0.03 ng/mL (ref <0.03)

## 2017-03-05 MED ORDER — TECHNETIUM TC 99M TETROFOSMIN IV KIT
10.0000 | PACK | Freq: Once | INTRAVENOUS | Status: AC | PRN
  Administered 2017-03-05: 10 via INTRAVENOUS

## 2017-03-05 MED ORDER — ASPIRIN 325 MG PO TABS
325.0000 mg | ORAL_TABLET | Freq: Every day | ORAL | Status: DC
  Administered 2017-03-05: 325 mg via ORAL
  Filled 2017-03-05: qty 1

## 2017-03-05 MED ORDER — BUPROPION HCL ER (SR) 100 MG PO TB12
200.0000 mg | ORAL_TABLET | Freq: Every day | ORAL | Status: DC
  Administered 2017-03-05: 200 mg via ORAL
  Filled 2017-03-05: qty 2

## 2017-03-05 MED ORDER — PERFLUTREN LIPID MICROSPHERE
1.0000 mL | INTRAVENOUS | Status: AC | PRN
  Administered 2017-03-05: 4 mL via INTRAVENOUS
  Filled 2017-03-05: qty 10

## 2017-03-05 MED ORDER — ALBUTEROL SULFATE (2.5 MG/3ML) 0.083% IN NEBU: 2.5000 mg | INHALATION_SOLUTION | RESPIRATORY_TRACT | Status: DC | PRN

## 2017-03-05 MED ORDER — SIMVASTATIN 40 MG PO TABS: 40.0000 mg | ORAL_TABLET | Freq: Every day | ORAL | Status: DC

## 2017-03-05 MED ORDER — GINKGO BILOBA 40 MG PO TABS
80.0000 mg | ORAL_TABLET | Freq: Every day | ORAL | Status: DC
Start: 2017-03-05 — End: 2017-03-05

## 2017-03-05 MED ORDER — NITROGLYCERIN 0.4 MG SL SUBL
0.4000 mg | SUBLINGUAL_TABLET | SUBLINGUAL | Status: DC | PRN
Start: 2017-03-05 — End: 2017-03-05

## 2017-03-05 MED ORDER — SODIUM CHLORIDE 0.9 % IV SOLN
INTRAVENOUS | Status: DC
  Administered 2017-03-05: 05:00:00 via INTRAVENOUS

## 2017-03-05 MED ORDER — AMINOPHYLLINE 25 MG/ML IV SOLN
INTRAVENOUS | Status: AC
  Filled 2017-03-05: qty 10

## 2017-03-05 MED ORDER — TIZANIDINE HCL 4 MG PO TABS
4.0000 mg | ORAL_TABLET | Freq: Two times a day (BID) | ORAL | Status: DC
  Administered 2017-03-05: 4 mg via ORAL
  Filled 2017-03-05: qty 1

## 2017-03-05 MED ORDER — PERFLUTREN LIPID MICROSPHERE
INTRAVENOUS | Status: AC
  Administered 2017-03-05: 4 mL via INTRAVENOUS
  Filled 2017-03-05: qty 10

## 2017-03-05 MED ORDER — REGADENOSON 0.4 MG/5ML IV SOLN
INTRAVENOUS | Status: AC
  Filled 2017-03-05: qty 5

## 2017-03-05 MED ORDER — MORPHINE SULFATE (PF) 4 MG/ML IV SOLN: 2.0000 mg | INTRAVENOUS | Status: DC | PRN

## 2017-03-05 MED ORDER — DULOXETINE HCL 30 MG PO CPEP
30.0000 mg | ORAL_CAPSULE | Freq: Every day | ORAL | Status: DC
  Administered 2017-03-05: 30 mg via ORAL
  Filled 2017-03-05: qty 1

## 2017-03-05 MED ORDER — IPRATROPIUM-ALBUTEROL 0.5-2.5 (3) MG/3ML IN SOLN
3.0000 mL | Freq: Four times a day (QID) | RESPIRATORY_TRACT | Status: DC
  Administered 2017-03-05: 3 mL via RESPIRATORY_TRACT
  Filled 2017-03-05 (×2): qty 3

## 2017-03-05 MED ORDER — AMLODIPINE BESYLATE 5 MG PO TABS
5.0000 mg | ORAL_TABLET | Freq: Every day | ORAL | 0 refills | Status: DC
Start: 2017-03-05 — End: 2024-09-02

## 2017-03-05 MED ORDER — NICOTINE 21 MG/24HR TD PT24
21.0000 mg | MEDICATED_PATCH | Freq: Every day | TRANSDERMAL | Status: DC
  Administered 2017-03-05: 21 mg via TRANSDERMAL
  Filled 2017-03-05: qty 1

## 2017-03-05 MED ORDER — HYDROCODONE-ACETAMINOPHEN 7.5-325 MG PO TABS
1.0000 | ORAL_TABLET | Freq: Once | ORAL | Status: AC
  Administered 2017-03-05: 1 via ORAL

## 2017-03-05 MED ORDER — HYDROCODONE-ACETAMINOPHEN 7.5-325 MG PO TABS
1.0000 | ORAL_TABLET | Freq: Once | ORAL | Status: DC
  Filled 2017-03-05: qty 1

## 2017-03-05 MED ORDER — DM-GUAIFENESIN ER 30-600 MG PO TB12: 1.0000 | ORAL_TABLET | Freq: Two times a day (BID) | ORAL | Status: DC | PRN

## 2017-03-05 MED ORDER — REGADENOSON 0.4 MG/5ML IV SOLN
0.4000 mg | Freq: Once | INTRAVENOUS | Status: AC
  Administered 2017-03-05: 0.4 mg via INTRAVENOUS
  Filled 2017-03-05: qty 5

## 2017-03-05 MED ORDER — HYDROCODONE-ACETAMINOPHEN 7.5-325 MG PO TABS
1.0000 | ORAL_TABLET | Freq: Two times a day (BID) | ORAL | Status: DC
  Administered 2017-03-05: 1 via ORAL
  Filled 2017-03-05: qty 1

## 2017-03-05 MED ORDER — TECHNETIUM TC 99M TETROFOSMIN IV KIT
30.0000 | PACK | Freq: Once | INTRAVENOUS | Status: AC | PRN
  Administered 2017-03-05: 30 via INTRAVENOUS

## 2017-03-05 NOTE — Progress Notes (Signed)
Pt has orders to be discharged. Discharge instructions given and pt has no additional questions at this time. Medication regimen reviewed and pt educated. Pt verbalized understanding and has no additional questions. Telemetry box removed. IV removed and site in good condition. Pt stable and waiting for transportation. 

## 2017-03-05 NOTE — Progress Notes (Signed)
Lexicscan myoview completed. Pending final read by radiology.   Margaret James

## 2017-03-05 NOTE — H&P (Signed)
History and Physical    Margaret James KZS:[id number] DOB: 1961-03-04 DOA: 03/05/2017  Referring MD/NP/PA:   PCP: [name], MD   Patient coming from:  The patient is coming from home.  At baseline, pt is independent for most of ADL.   Chief Complaint: Chest pain  HPI: Margaret James is a 56 y.o. female with medical history significant of hyperlipidemia, COPD, depression, tobacco abuse, uterus cancer (30 years ago, s/p of partial hysterectomy, no chemotherapy or radiation therapy), recently diagnosed breast cancer (not started treatment yet), who presents with chest pain.  Patient states that she suddenly started having chest pain last night, it is located in the lower chest, crossing her breasts. It was sharp, 10 out of 10 in severity, nonradiating. It lasted for 5-10 minutes, then resolved spontaneously. Later on, she had another episode of similar chest pain, which lasted for 20-30 minutes, then resolved spontaneously. It was not pleuritic. Currently patient does not have chest pain. Patient states that she has SOB which is slightly worse than in the baseline. She also has cough with yellow colored sputum production, which is close to baseline of her COPD. no fever or chills. No vision. Patient denies GI symptoms, no nausea, vomiting, diarrhea or abdominal pain. No symptoms of UTI or unilateral weakness. Of note, patient traveled from [place] 1 month ago by driving (5 hour). Denies tenderness in the calf area.  ED Course: pt was found to have negative troponin, WBC 7.8, electrolytes renal function okay, no fever, no tachycardia, oxygen saturation 91-94% on room air. Chest x-ray showed chronic bronchitic change. Patient is placed on telemetry bed for observation.  Review of Systems:   General: no fevers, chills, no changes in body weight HEENT: no blurry vision, hearing changes or sore throat Respiratory: has dyspnea, coughing, no wheezing CV: has chest pain, no palpitations GI: no  nausea, vomiting, abdominal pain, diarrhea, constipation GU: no dysuria, burning on urination, increased urinary frequency, hematuria  Ext: no leg edema Neuro: no unilateral weakness, numbness, or tingling, no vision change or hearing loss Skin: no rash, no skin tear. MSK: No muscle spasm, no deformity, no limitation of range of movement in spin Heme: No easy bruising.  Travel history: No recent long distant travel.  Allergy:  Allergies  Allergen Reactions  . Lyrica [Pregabalin] Other (See Comments)    homocidal/suicidal    Past Medical History:  Diagnosis Date  . Breast cancer ([place])   . COPD (chronic obstructive pulmonary disease) ([place])   . Depression   . HLD (hyperlipidemia)   . Tobacco abuse   . Uterus cancer [facility])     Past Surgical History:  Procedure Laterality Date  . PARTIAL HYSTERECTOMY    . Progress the biopsy Left   . TONSILLECTOMY      Social History:  reports that she has been smoking Cigarettes.  She has been smoking about 1.00 pack per day. She has never used smokeless tobacco. She reports that she does not drink alcohol or use drugs.  Family History:  Family History  Problem Relation Age of Onset  . COPD Mother   . Heart disease Father   . Lung cancer Sister      Prior to Admission medications   Medication Sig Start Date End Date Taking? Authorizing Provider  buPROPion (WELLBUTRIN SR) 200 MG 12 hr tablet Take 200 mg by mouth daily. 01/16/17  Yes Historical Provider, MD  DULoxetine (CYMBALTA) 30 MG capsule Take 30 mg by mouth daily. 01/20/17  Yes Historical Provider,  MD  Ginkgo Biloba 40 MG TABS Take 80 mg by mouth daily.   Yes Historical Provider, MD  HYDROcodone-acetaminophen (NORCO) 7.5-325 MG tablet Take 1 tablet by mouth 2 (two) times daily. 02/06/17  Yes Historical Provider, MD  simvastatin (ZOCOR) 40 MG tablet Take 40 mg by mouth at bedtime. 02/05/17  Yes Historical Provider, MD  tiZANidine (ZANAFLEX) 4 MG tablet Take 4 mg by mouth 2 (two) times  daily. 02/05/17  Yes Historical Provider, MD    Physical Exam: Vitals:   03/05/17 0145 03/05/17 0230 03/05/17 0245 03/05/17 0345  BP: (!) 147/78 133/81 128/68 (!) 135/103  Pulse: 79 73 66 73  Resp: 17 14 15 13   SpO2: 94% 93% 91% 94%  Weight:      Height:       General: Not in acute distress HEENT:       Eyes: PERRL, EOMI, no scleral icterus.       ENT: No discharge from the ears and nose, no pharynx injection, no tonsillar enlargement.        Neck: No JVD, no bruit, no mass felt. Heme: No neck lymph node enlargement. Cardiac: S1/S2, RRR, No murmurs, No gallops or rubs. Respiratory: No rales, wheezing, rhonchi or rubs. GI: Soft, nondistended, nontender, no rebound pain, no organomegaly, BS present. GU: No hematuria Ext: No pitting leg edema bilaterally. 2+DP/PT pulse bilaterally. Musculoskeletal: No joint deformities, No joint redness or warmth, no limitation of ROM in spin. Skin: No rashes.  Neuro: Alert, oriented X3, cranial nerves II-XII grossly intact, moves all extremities normally.  Psych: Patient is not psychotic, no suicidal or hemocidal ideation.  Labs on Admission: I have personally reviewed following labs and imaging studies  CBC:  Recent Labs Lab 03/05/17 0214  WBC 7.8  NEUTROABS 4.8  HGB 15.1*  HCT 44.7  MCV 93.1  PLT 329   Basic Metabolic Panel:  Recent Labs Lab 03/05/17 0214  NA 137  K 4.5  CL 101  CO2 26  GLUCOSE 110*  BUN 9  CREATININE 0.87  CALCIUM 9.1   GFR: Estimated Creatinine Clearance: 78.6 mL/min (by C-G formula based on SCr of 0.87 mg/dL). Liver Function Tests: No results for input(s): AST, ALT, ALKPHOS, BILITOT, PROT, ALBUMIN in the last 168 hours. No results for input(s): LIPASE, AMYLASE in the last 168 hours. No results for input(s): AMMONIA in the last 168 hours. Coagulation Profile: No results for input(s): INR, PROTIME in the last 168 hours. Cardiac Enzymes: No results for input(s): CKTOTAL, CKMB, CKMBINDEX, TROPONINI in  the last 168 hours. BNP (last 3 results) No results for input(s): PROBNP in the last 8760 hours. HbA1C: No results for input(s): HGBA1C in the last 72 hours. CBG: No results for input(s): GLUCAP in the last 168 hours. Lipid Profile: No results for input(s): CHOL, HDL, LDLCALC, TRIG, CHOLHDL, LDLDIRECT in the last 72 hours. Thyroid Function Tests: No results for input(s): TSH, T4TOTAL, FREET4, T3FREE, THYROIDAB in the last 72 hours. Anemia Panel: No results for input(s): VITAMINB12, FOLATE, FERRITIN, TIBC, IRON, RETICCTPCT in the last 72 hours. Urine analysis: No results found for: COLORURINE, APPEARANCEUR, LABSPEC, PHURINE, GLUCOSEU, HGBUR, BILIRUBINUR, KETONESUR, PROTEINUR, UROBILINOGEN, NITRITE, LEUKOCYTESUR Sepsis Labs: @LABRCNTIP (procalcitonin:4,lacticidven:4) )No results found for this or any previous visit (from the past 240 hour(s)).   Radiological Exams on Admission: Dg Chest 2 View  Result Date: 03/05/2017 CLINICAL DATA:  Central chest pain and shortness of breath. EXAM: CHEST  2 VIEW COMPARISON:  Radiographs 12/08/2016 FINDINGS: Lower lung volumes from prior exam. There  is diffuse bronchial thickening. Streaky left basilar atelectasis. No confluent airspace disease. Unchanged heart size and mediastinal contours allowing for lower lung volumes. No pleural fluid or pneumothorax. No acute osseous abnormalities are seen. IMPRESSION: Bronchial thickening which appears chronic. Streaky left basilar atelectasis. Electronically Signed   By: Jeb Levering M.D.   On: 03/05/2017 02:23     EKG: Independently reviewed. Sinus rhythm, QTC 44, LAD, low voltage, nonspecific T-wave change.  Assessment/Plan Principal Problem:   Chest pain Active Problems:   HLD (hyperlipidemia)   Depression   Breast cancer (HCC)   Tobacco abuse   COPD (chronic obstructive pulmonary disease) (HCC)   Chest pain: Patient has atypical chest pain, etiology is not clear. Differential diagnoses include ACS  and EP. Risk factors for ACS include old age, tobacco abuse and hyperlipidemia. Patient has newly diagnosed breast cancer and hx of recent traveling, increased risk of getting DVT and PE.  - will place on Tele bed for obs - cycle CE q6 x3 and repeat EKG in the am  - Nitroglycerin, Morphine, and aspirin, zocor  - Risk factor stratification: will check FLP, UDS and A1C  - 2d echo -will get stat D-dimer. If positive, will get CTA to r/o PE.  HLD: Last LDL was not on record. -Continue home medications: zocor -f/u FLP  Depression: Stable, no suicidal or homicidal ideations. -Continue home medications: Wellbutrin, Cymbalta  Tobacco abuse: -Did counseling about importance of quitting smoking -Nicotine patch  COPD: stable. No wheezing or rhonchi on auscultation. -DuoNeb nebulizers every 6 hours -When necessary albuterol nebulizer -When necessary Mucinex for cough  Newly diagnosed Left Breast cancer: Biopsy on 01/30/17 showed ductal carcinoma in situ, invasive. Pt states that she was supposed to be called back by cancer center, but not received call yet, possibly today. She said that she plans to get treatment in [facility]. -Encourage patient to not miss appointment if she is called by cancer center.  DVT ppx: SQ Lovenox Code Status: Full code Family Communication: None at bed side.    Disposition Plan:  Anticipate discharge back to previous home environment Consults called:  none Admission status: Obs / tele    Date of Service 03/05/2017    Ivor Costa [facility] Pager [phone]  If 7PM-7AM, please contact night-coverage [email or web address] Password [facility] 03/05/2017, 4:22 AM

## 2017-03-05 NOTE — Progress Notes (Signed)
PHARMACIST - PHYSICIAN ORDER COMMUNICATION  CONCERNING: P&T Medication Policy on Herbal Medications  DESCRIPTION:  This patient's order for:  Ginkgo biloba  has been noted.  This product(s) is classified as an "herbal" or natural product. Due to a lack of definitive safety studies or FDA approval, nonstandard manufacturing practices, plus the potential risk of unknown drug-drug interactions while on inpatient medications, the Pharmacy and Therapeutics Committee does not permit the use of "herbal" or natural products of this type within [facility].   ACTION TAKEN: The pharmacy department is unable to verify this order at this time and your patient has been informed of this safety policy. Please reevaluate patient's clinical condition at discharge and address if the herbal or natural product(s) should be resumed at that time.  Sherlon Handing, PharmD, BCPS Clinical pharmacist, pager [phone] 03/05/2017 4:19 AM

## 2017-03-05 NOTE — Discharge Summary (Addendum)
Physician Discharge Summary  Margaret James ZDG:[id number] DOB: 1961/02/04 DOA: 03/05/2017  PCP: Margaret Neighbors, MD  Admit date: 03/05/2017 Discharge date: 03/05/2017   Recommendations for Outpatient Follow-Up:   1. outpatient BP medications titration   Discharge Diagnosis:   Principal Problem:   Chest pain Active Problems:   HLD (hyperlipidemia)   Depression   Breast cancer (HCC)   Tobacco abuse   COPD (chronic obstructive pulmonary disease) ([place])   Discharge disposition:  Home  Discharge Condition: Improved.  Diet recommendation: Low sodium, heart healthy  Wound care: None.   History of Present Illness:   Margaret James is a 56 y.o. female with medical history significant of hyperlipidemia, COPD, depression, tobacco abuse, uterus cancer (30 years ago, s/p of partial hysterectomy, no chemotherapy or radiation therapy), recently diagnosed breast cancer (not started treatment yet), who presents with chest pain.  Patient states that she suddenly started having chest pain last night, it is located in the lower chest, crossing her breasts. It was sharp, 10 out of 10 in severity, nonradiating. It lasted for 5-10 minutes, then resolved spontaneously. Later on, she had another episode of similar chest pain, which lasted for 20-30 minutes, then resolved spontaneously. It was not pleuritic. Currently patient does not have chest pain. Patient states that she has SOB which is slightly worse than in the baseline. She also has cough with yellow colored sputum production, which is close to baseline of her COPD. no fever or chills. No vision. Patient denies GI symptoms, no nausea, vomiting, diarrhea or abdominal pain. No symptoms of UTI or unilateral weakness. Of note, patient traveled from [place] 1 month ago by driving (5 hour). Denies tenderness in the calf area.   Hospital Course by Problem:   Chest pain Low risk stress test  Tobacco abuse -encourage cessation  Newly  diagnosed Left Breast cancer: Biopsy on 01/30/17 showed ductal carcinoma in situ, invasive.  Following at the cancer center treatments of [place]-- for double mastectomy  COPD: stable. No wheezing or rhonchi on auscultation. -DuoNeb nebulizers every 6 hours -When necessary albuterol nebulizer -When necessary Mucinex for cough  HLD -continue home meds  Depression: Stable, no suicidal or homicidal ideations. -Continue home medications: Wellbutrin, Cymbalta  HTN -add norvasc at home -PCP to titrate   Medical Consultants:    cards   Discharge Exam:   Vitals:   03/05/17 1142 03/05/17 1144  BP: (!) 174/93 (!) 164/93  Pulse:    Resp:    Temp:     Vitals:   03/05/17 1125 03/05/17 1140 03/05/17 1142 03/05/17 1144  BP: (!) 145/85 (!) 172/95 (!) 174/93 (!) 164/93  Pulse:      Resp:      Temp:      TempSrc:      SpO2:      Weight:      Height:        Gen:  NAD    The results of significant diagnostics from this hospitalization (including imaging, microbiology, ancillary and laboratory) are listed below for reference.     Procedures and Diagnostic Studies:   Dg Chest 2 View  Result Date: 03/05/2017 CLINICAL DATA:  Central chest pain and shortness of breath. EXAM: CHEST  2 VIEW COMPARISON:  Radiographs 12/08/2016 FINDINGS: Lower lung volumes from prior exam. There is diffuse bronchial thickening. Streaky left basilar atelectasis. No confluent airspace disease. Unchanged heart size and mediastinal contours allowing for lower lung volumes. No pleural fluid or pneumothorax. No acute osseous abnormalities are seen. IMPRESSION:  Bronchial thickening which appears chronic. Streaky left basilar atelectasis. Electronically Signed   By: Jeb Levering M.D.   On: 03/05/2017 02:23   Nm Myocar Multi W/spect W/wall Motion / Ef  Result Date: 03/05/2017 CLINICAL DATA:  Chest pain. Smoking history. Hyperlipidemia. COPD. EXAM: MYOCARDIAL IMAGING WITH SPECT (REST AND PHARMACOLOGIC-STRESS)  GATED LEFT VENTRICULAR WALL MOTION STUDY LEFT VENTRICULAR EJECTION FRACTION TECHNIQUE: Standard myocardial SPECT imaging was performed after resting intravenous injection of 10 mCi Tc-31m tetrofosmin. Subsequently, intravenous infusion of Lexiscan was performed under the supervision of the Cardiology staff. At peak effect of the drug, 30 mCi Tc-57m tetrofosmin was injected intravenously and standard myocardial SPECT imaging was performed. Quantitative gated imaging was also performed to evaluate left ventricular wall motion, and estimate left ventricular ejection fraction. COMPARISON:  Two-view chest x-ray 03/05/2017 FINDINGS: Perfusion: There is decreased uptake within the lateral wall on both rest and stress images compatible with a remote infarct. No inducible ischemia is present. Wall Motion: Normal left ventricular wall motion. No left ventricular dilation. Left Ventricular Ejection Fraction: 56 % End diastolic volume 86 ml End systolic volume 38 ml IMPRESSION: 1. No reversible ischemia or infarction. 2. Normal left ventricular wall motion. 3. Left ventricular ejection fraction 56% 4. Non invasive risk stratification*: Low *2012 Appropriate Use Criteria for Coronary Revascularization Focused Update: J Am Coll Cardiol. 7517;00(1):749-449. http://content.[email or web address]?[email or web address] Electronically Signed   By: San Morelle M.D.   On: 03/05/2017 14:47     Labs:   Basic Metabolic Panel:  Recent Labs Lab 03/05/17 0214  NA 137  K 4.5  CL 101  CO2 26  GLUCOSE 110*  BUN 9  CREATININE 0.87  CALCIUM 9.1   GFR Estimated Creatinine Clearance: 80.2 mL/min (by C-G formula based on SCr of 0.87 mg/dL). Liver Function Tests: No results for input(s): AST, ALT, ALKPHOS, BILITOT, PROT, ALBUMIN in the last 168 hours. No results for input(s): LIPASE, AMYLASE in the last 168 hours. No results for input(s): AMMONIA in the last 168 hours. Coagulation profile No results for input(s):  INR, PROTIME in the last 168 hours.  CBC:  Recent Labs Lab 03/05/17 0214  WBC 7.8  NEUTROABS 4.8  HGB 15.1*  HCT 44.7  MCV 93.1  PLT 270   Cardiac Enzymes:  Recent Labs Lab 03/05/17 0420 03/05/17 1226  TROPONINI <0.03 <0.03   BNP: Invalid input(s): POCBNP CBG: No results for input(s): GLUCAP in the last 168 hours. D-Dimer  Recent Labs  03/05/17 0358  DDIMER 0.37   Hgb A1c No results for input(s): HGBA1C in the last 72 hours. Lipid Profile  Recent Labs  03/05/17 0435  CHOL 196  HDL 39*  LDLCALC 108*  TRIG 243*  CHOLHDL 5.0   Thyroid function studies No results for input(s): TSH, T4TOTAL, T3FREE, THYROIDAB in the last 72 hours.  Invalid input(s): FREET3 Anemia work up No results for input(s): VITAMINB12, FOLATE, FERRITIN, TIBC, IRON, RETICCTPCT in the last 72 hours. Microbiology No results found for this or any previous visit (from the past 240 hour(s)).   Discharge Instructions:   Discharge Instructions    Diet - low sodium heart healthy    Complete by:  As directed    Discharge instructions    Complete by:  As directed    Stress test low risk Tobacco cessation BP elevated- norvasc started for hypertension   Increase activity slowly    Complete by:  As directed      Allergies as of 03/05/2017      Reactions  Lyrica [pregabalin] Other (See Comments)   homocidal/suicidal      Medication List    TAKE these medications   amLODipine 5 MG tablet Commonly known as:  NORVASC Take 1 tablet (5 mg total) by mouth daily.   buPROPion 200 MG 12 hr tablet Commonly known as:  WELLBUTRIN SR Take 200 mg by mouth daily.   DULoxetine 30 MG capsule Commonly known as:  CYMBALTA Take 30 mg by mouth daily.   Ginkgo Biloba 40 MG Tabs Take 80 mg by mouth daily.   HYDROcodone-acetaminophen 7.5-325 MG tablet Commonly known as:  NORCO Take 1 tablet by mouth 2 (two) times daily.   simvastatin 40 MG tablet Commonly known as:  ZOCOR Take 40 mg by  mouth at bedtime.   tiZANidine 4 MG tablet Commonly known as:  ZANAFLEX Take 4 mg by mouth 2 (two) times daily.         Time coordinating discharge: 25 min  Signed:  Latiya Navia U Natacha Jepsen   [facility] 03/05/2017, 3:04 PM

## 2017-03-05 NOTE — Consult Note (Signed)
CARDIOLOGY CONSULT NOTE   Patient ID: Margaret James MRN: [id number] DOB/AGE: 1961-06-30 56 y.o.  Admit date: 03/05/2017  Primary Physician   [name], MD Primary Cardiologist   New to Dr. Martinique Reason for Consultation Chest pain  Requesting Physician  Dr. Eliseo Squires  HPI: Margaret James is a 56 y.o. female with a history of HLD, COPD, ongoing tobacco abuse, Uterus cancer 30 years ago and recent diagnosis of bilateral breast cancer (plan for my mastectomy) presents for chest pain.   No prior cardiac evaluation. History of tobacco smoking for at least 40 years. Currently smoking half a pack a day. Father has a massive heart attack x 3, first one in his 53s.  Patient had a sudden onset of lower sternal chest pain while watching TV last night at 11 PM. It was radiated across her both breast. Described as a sharp in character. Associated with shortness of breath however states that she has a chronic shortness of breath due to COPD. No radiation to her back or shoulder. No nausea. Sweaty hand. Pain lasted for 5-10 minutes with spontaneous resolution. However she had a recurrent intermittent episode leading to EMS call. Her pain resolved completely after aspirin. No sublingual nitroglycerin given. She denies orthopnea, PND, syncope, lower to mid edema, melena or blood in her stool or urine. She has a chronic dyspnea on exertion due to COPD.  Oxygen saturation of 91-94%. D-dimer negative. Chest x-ray shows chronic bronchitis changes. EKG shows normal sinus rhythm without acute abnormality. No prior EKG to compare. Troponin negative x 1. LDL 108.   Past Medical History:  Diagnosis Date  . Breast cancer ([place])   . COPD (chronic obstructive pulmonary disease) ([place])   . Depression   . HLD (hyperlipidemia)   . Tobacco abuse   . Uterus cancer [facility])      Past Surgical History:  Procedure Laterality Date  . PARTIAL HYSTERECTOMY    . Progress the biopsy Left   . TONSILLECTOMY       Allergies  Allergen Reactions  . Lyrica [Pregabalin] Other (See Comments)    homocidal/suicidal    I have reviewed the patient's current medications . aspirin  325 mg Oral Daily  . buPROPion  200 mg Oral Daily  . DULoxetine  30 mg Oral Daily  . HYDROcodone-acetaminophen  1 tablet Oral BID  . ipratropium-albuterol  3 mL Nebulization Q6H  . nicotine  21 mg Transdermal Daily  . simvastatin  40 mg Oral QHS  . tiZANidine  4 mg Oral BID   . sodium chloride 75 mL/hr at 03/05/17 0526   albuterol, dextromethorphan-guaiFENesin, morphine injection, nitroGLYCERIN  Prior to Admission medications   Medication Sig Start Date End Date Taking? Authorizing Provider  buPROPion (WELLBUTRIN SR) 200 MG 12 hr tablet Take 200 mg by mouth daily. 01/16/17  Yes Historical Provider, MD  DULoxetine (CYMBALTA) 30 MG capsule Take 30 mg by mouth daily. 01/20/17  Yes Historical Provider, MD  Ginkgo Biloba 40 MG TABS Take 80 mg by mouth daily.   Yes Historical Provider, MD  HYDROcodone-acetaminophen (NORCO) 7.5-325 MG tablet Take 1 tablet by mouth 2 (two) times daily. 02/06/17  Yes Historical Provider, MD  simvastatin (ZOCOR) 40 MG tablet Take 40 mg by mouth at bedtime. 02/05/17  Yes Historical Provider, MD  tiZANidine (ZANAFLEX) 4 MG tablet Take 4 mg by mouth 2 (two) times daily. 02/05/17  Yes Historical Provider, MD     Social History   Social History  . Marital status: Married  Spouse name: N/A  . Number of children: N/A  . Years of education: N/A   Occupational History  . Not on file.   Social History Main Topics  . Smoking status: Current Every Day Smoker    Packs/day: 1.00    Types: Cigarettes  . Smokeless tobacco: Never Used  . Alcohol use No  . Drug use: No  . Sexual activity: Not on file   Other Topics Concern  . Not on file   Social History Narrative  . No narrative on file    Family Status  Relation Status  . Mother   . Father   . Sister    Family History  Problem Relation  Age of Onset  . COPD Mother   . Heart disease Father   . Lung cancer Sister      ROS:  Full 14 point review of systems complete and found to be negative unless listed above.  Physical Exam: Blood pressure (!) 141/77, pulse (!) 55, temperature 98.1 F (36.7 C), temperature source Oral, resp. rate 16, height 5\' 6"  (1.676 m), weight 191 lb 14.4 oz (87 kg), SpO2 97 %.  General: Well developed, well nourished, female in no acute distress Head: Eyes PERRLA, No xanthomas. Normocephalic and atraumatic, oropharynx without edema or exudate.  Lungs: Resp regular and unlabored, CTA. Heart: RRR no s3, s4, or murmurs..   Neck: No carotid bruits. No lymphadenopathy.  No JVD. Abdomen: Bowel sounds present, abdomen soft and non-tender without masses or hernias noted. Msk:  No spine or cva tenderness. No weakness, no joint deformities or effusions. Extremities: No clubbing, cyanosis or edema. DP/PT/Radials 2+ and equal bilaterally. Neuro: Alert and oriented X 3. No focal deficits noted. Psych:  Good affect, responds appropriately Skin: No rashes or lesions noted.  Labs:   Lab Results  Component Value Date   WBC 7.8 03/05/2017   HGB 15.1 (H) 03/05/2017   HCT 44.7 03/05/2017   MCV 93.1 03/05/2017   PLT 270 03/05/2017   No results for input(s): INR in the last 72 hours.  Recent Labs Lab 03/05/17 0214  NA 137  K 4.5  CL 101  CO2 26  BUN 9  CREATININE 0.87  CALCIUM 9.1  GLUCOSE 110*   No results found for: MG  Recent Labs  03/05/17 0420  TROPONINI <0.03    Recent Labs  03/05/17 0227  TROPIPOC 0.00   No results found for: PROBNP Lab Results  Component Value Date   CHOL 196 03/05/2017   HDL 39 (L) 03/05/2017   LDLCALC 108 (H) 03/05/2017   TRIG 243 (H) 03/05/2017   Lab Results  Component Value Date   DDIMER 0.37 03/05/2017   No results found for: LIPASE, AMYLASE No results found for: TSH, T4TOTAL, T3FREE, THYROIDAB No results found for: VITAMINB12, FOLATE, FERRITIN,  TIBC, IRON, RETICCTPCT  Echo: Pending  Radiology:  Dg Chest 2 View  Result Date: 03/05/2017 CLINICAL DATA:  Central chest pain and shortness of breath. EXAM: CHEST  2 VIEW COMPARISON:  Radiographs 12/08/2016 FINDINGS: Lower lung volumes from prior exam. There is diffuse bronchial thickening. Streaky left basilar atelectasis. No confluent airspace disease. Unchanged heart size and mediastinal contours allowing for lower lung volumes. No pleural fluid or pneumothorax. No acute osseous abnormalities are seen. IMPRESSION: Bronchial thickening which appears chronic. Streaky left basilar atelectasis. Electronically Signed   By: Jeb Levering M.D.   On: 03/05/2017 02:23    ASSESSMENT AND PLAN:     1. Chest pain -  Seems atypical. Troponin negative. EKG without acute changes, no prior EKG to compare. Chest pain-free since admission. D-dimer negative. Chest x-ray shows chronic bronchitis changes. - Continue cycle troponin. Echocardiogram being done, pending reading. Her cardiac risk factor includes hyperlipidemia, ongoing tobacco abuse and strong family history of CAD. Keep nothing by mouth. Will do lexiscan Myoview. If echo and stress test normal, she can be discharge later today.   2. HLD - 03/05/2017: Cholesterol 196; HDL 39; LDL Cholesterol 108; Triglycerides 243; VLDL 49  - Continue statin.   3. Tobacco abuse - Cessation advised.   SignedLeanor Kail, PA 03/05/2017, 8:51 AM Pager [phone]  Patient seen and examined and history reviewed. Agree with above findings and plan. 56 yo WF presents for evaluation of chest pain. Recently diagnosed bilateral breast CA. History of chronic tobacco abuse, COPD, HLD, and family history of early CAD. Yesterday developed CP while watching TV. Describes this as sharp, 10/10, radiating across her chest. Worse with deep breathing. Pain now resolved. troponins are negative Ecg with LAD and nonspecific TWA. I have personally reviewed and interpreted this  study. On exam she is an obese WF with moderate wheezing and appears SOB. No JVD.  No bruits. Lungs with scattered wheezing. CV without gallop or murmur. Abd obese and nontender. Pulses 2+. No edema.  Impression. Atypical chest pain in patient with multiple cardiac risk factors. Echo is pending. Given need for upcoming general anesthesia for breast surgery recommend nuclear stress test to further assess cardiac risk. Will schedule for today. Patient is agreeable with this evaluation.  [name], [place] 03/05/2017 10:27 AM

## 2017-03-05 NOTE — Progress Notes (Signed)
  Echocardiogram 2D Echocardiogram with definity has been performed.  Darlina Sicilian M 03/05/2017, 9:37 AM

## 2017-03-05 NOTE — ED Provider Notes (Signed)
[facility] DEPT Provider Note   CSN: [id number] Arrival date & time: 03/05/17  0044  By signing my name below, I, Oleh Genin, attest that this documentation has been prepared under the direction and in the presence of Ripley Fraise, MD. Electronically Signed: Oleh Genin, Scribe. 03/05/17. 1:28 AM.   History   Chief Complaint Chief Complaint  Patient presents with  . Chest Pain    HPI Pretty Wrightsman is a 56 y.o. female with history of COPD not on home oxygen and recent diagnosis of breast cancer not on chemoradiation therapy with upcoming mastectomy who presents to the ED for evaluation of chest pain. This patient states that approximately 2 hours ago she was watching television when she had sudden onset of bilateral chest pain which was "sharp" and constant for 5 minutes. Non-radiating. She did experience "sweating palms" as well as mild dyspnea she attributes to COPD. She took 324mg  aspirin pre-hospital. No medications given by medic. At interview, she denies any lightheadedness, nausea, or abdominal pain today. No syncope. No painful respirations. No coughing. No focal weaknesses. She is seen in [place] for her oncology care. No history of MI, CVA, or DVT/PE. Family history of MI. She is a tobacco user.   The history is provided by the patient. No language interpreter was used.  Chest Pain   This is a new problem. The current episode started 1 to 2 hours ago. The problem occurs rarely. The problem has not changed since onset.Pain location: bilateral. Associated symptoms include palpitations ("sweating palms") and shortness of breath. Pertinent negatives include no abdominal pain, no nausea and no weakness.   PMH - COPD, hyperlipidemia, breast CA Soc hx - smoker Family history - CAD OB History    No data available      Home Medications    Prior to Admission medications   Not on File    Family History History reviewed. No pertinent family  history.  Social History Social History  Substance Use Topics  . Smoking status: Current Every Day Smoker    Packs/day: 1.00    Types: Cigarettes  . Smokeless tobacco: Never Used  . Alcohol use No     Allergies   Lyrica [pregabalin]   Review of Systems Review of Systems  Respiratory: Positive for shortness of breath.   Cardiovascular: Positive for chest pain and palpitations ("sweating palms").  Gastrointestinal: Negative for abdominal pain and nausea.  Neurological: Negative for syncope and weakness.  All other systems reviewed and are negative.    Physical Exam Updated Vital Signs BP (!) 150/83   Pulse 71   Resp 16   Ht 5\' 5"  (1.651 m)   Wt 192 lb (87.1 kg)   SpO2 97%   BMI 31.95 kg/m   Physical Exam CONSTITUTIONAL: Well developed/well nourished HEAD: Normocephalic/atraumatic EYES: EOMI/PERRL ENMT: Mucous membranes moist NECK: supple no meningeal signs SPINE/BACK:entire spine nontender CV: S1/S2 noted, no murmurs/rubs/gallops noted LUNGS: Lungs are clear to auscultation bilaterally, no apparent distress ABDOMEN: soft, nontender, no rebound or guarding, bowel sounds noted throughout abdomen GU:no cva tenderness NEURO: Pt is awake/alert/appropriate, moves all extremitiesx4.  No facial droop.   EXTREMITIES: pulses normal/equal, full ROM, no calf tenderness or edema SKIN: warm, color normal PSYCH: no abnormalities of mood noted, alert and oriented to situation  ED Treatments / Results  Labs (all labs ordered are listed, but only abnormal results are displayed) Labs Reviewed  BASIC METABOLIC PANEL - Abnormal; Notable for the following:       Result Value  Glucose, Bld 110 (*)    All other components within normal limits  CBC WITH DIFFERENTIAL/PLATELET - Abnormal; Notable for the following:    Hemoglobin 15.1 (*)    All other components within normal limits  D-DIMER, QUANTITATIVE (NOT AT [facility])  Randolm Idol, ED    EKG  EKG  Interpretation  Date/Time:  Monday March 05 2017 00:47:24 EDT Ventricular Rate:  65 PR Interval:    QRS Duration: 100 QT Interval:  388 QTC Calculation: 404 R Axis:   -21 Text Interpretation:  Sinus rhythm Borderline left axis deviation Low voltage, precordial leads Abnormal R-wave progression, early transition Borderline T abnormalities, anterior leads No previous ECGs available Interpretation limited secondary to artifact Confirmed by Christy Gentles  MD, Romel Dumond ([id number]) on 03/05/2017 1:01:02 AM       Radiology Dg Chest 2 View  Result Date: 03/05/2017 CLINICAL DATA:  Central chest pain and shortness of breath. EXAM: CHEST  2 VIEW COMPARISON:  Radiographs 12/08/2016 FINDINGS: Lower lung volumes from prior exam. There is diffuse bronchial thickening. Streaky left basilar atelectasis. No confluent airspace disease. Unchanged heart size and mediastinal contours allowing for lower lung volumes. No pleural fluid or pneumothorax. No acute osseous abnormalities are seen. IMPRESSION: Bronchial thickening which appears chronic. Streaky left basilar atelectasis. Electronically Signed   By: Jeb Levering M.D.   On: 03/05/2017 02:23    Procedures Procedures (including critical care time)  Medications Ordered in ED Medications  HYDROcodone-acetaminophen (NORCO) 7.5-325 MG per tablet 1 tablet (not administered)     Initial Impression / Assessment and Plan / ED Course  I have reviewed the triage vital signs and the nursing notes.  Pertinent labs & imaging results that were available during my care of the patient were reviewed by me and considered in my medical decision making (see chart for details).     3:00 AM HEART score - 4 Will advise admission 4:17 AM Advised admission Pt agreeable She has already taken ASA prior to arrival She is CP free at this time D/w Dr Blaine Hamper for admission Denies pleuritic CP Given nature/history of CP I feel ACS is more likely  Final Clinical Impressions(s) / ED  Diagnoses   Final diagnoses:  Hyperlipidemia, unspecified hyperlipidemia type  Pulmonary emphysema, unspecified emphysema type ([place])    New Prescriptions New Prescriptions   No medications on file  I personally performed the services described in this documentation, which was scribed in my presence. The recorded information has been reviewed and is accurate.       Ripley Fraise, MD 03/05/17 [phone]

## 2017-03-05 NOTE — ED Triage Notes (Signed)
Pt arrived via [facility] from home c/o generalized chest pain lasting 5 minutes and hour ago.  Pt has recent diagnosis of breast cancer.

## 2017-03-07 ENCOUNTER — Other Ambulatory Visit: Payer: Self-pay | Admitting: General Surgery

## 2017-03-07 ENCOUNTER — Telehealth: Payer: Self-pay | Admitting: Hematology

## 2017-03-07 ENCOUNTER — Encounter: Payer: Self-pay | Admitting: Hematology

## 2017-03-07 LAB — HEMOGLOBIN A1C
Hgb A1c MFr Bld: 5.3 % (ref 4.8–5.6)
Mean Plasma Glucose: 105 mg/dL

## 2017-03-07 NOTE — Telephone Encounter (Signed)
Received a staff msg from [employer] at [employer]. Multiple attempts had been made to get the pt seen in our office. Bailey Mech stated the pt didn't cb due to inability to pay her $50 copay.  Told Robyn that I would forward a message to the financial counselors to see what they can do to help the pt. Scheduled the pt to see [name] on 4/5 at 11am. Will mail the pt a letter. Message sent to [place] and [place].

## 2017-03-07 NOTE — Progress Notes (Signed)
Received message from scheduler regarding patient not having copay.   Called patient to introduce myself as her Arboriculturist and to discuss her concern. Patient states she does not have $50 for her office visit copay. Advised patient they will ask her for the copay at the time of her check-in but if she does not have it, she can be billed for it later. Patient thanked me and advised she will be keeping her appointment.

## 2017-03-15 ENCOUNTER — Ambulatory Visit: Payer: Managed Care, Other (non HMO) | Admitting: Hematology

## 2017-03-15 ENCOUNTER — Telehealth: Payer: Self-pay | Admitting: Hematology

## 2017-03-15 NOTE — Telephone Encounter (Signed)
Ms. Probasco cld to cancel her appt w/ [name] today at 11am. She stated that she doesn't have the money for her co-pay. I reassured her that she could still be seen and that our facility would bill her. Pt stil declined and stated that she will see [place] after surgery. I voiced understanding. Msg sent to Robin at [facility] and breast navigators to make aware of the pt's decision.

## 2017-03-27 ENCOUNTER — Inpatient Hospital Stay (HOSPITAL_COMMUNITY)
Admission: RE | Admit: 2017-03-27 | Discharge: 2017-03-27 | Disposition: A | Discharge status: Home / Self Care | Payer: Managed Care, Other (non HMO) | Source: Ambulatory Visit

## 2017-03-27 NOTE — Pre-Procedure Instructions (Signed)
Margaret James  03/27/2017      [facility] - [place], [place] - [place] [address] Phone: [phone] Fax: [phone]    Your procedure is scheduled on April 20  Report to [facility] at 1145 A.M.  Call this number if you have problems the morning of surgery:  [phone]   Remember:  Do not eat food or drink liquids after midnight.   Take these medicines the morning of surgery with A SIP OF WATER albuterol (PROVENTIL HFA;VENTOLIN HFA)  If needed, ALPRAZolam (XANAX), amLODipine (NORVASC),  buPROPion (WELLBUTRIN SR), DULoxetine (CYMBALTA), HYDROcodone-acetaminophen (NORCO),  Tiotropium Bromide-Olodaterol (STIOLTO RESPIMAT), tiZANidine (ZANAFLEX)   Take all other medications as prescribed except 7 days prior to surgery STOP taking any Aspirin, Aleve, Naproxen, Ibuprofen, Motrin, Advil, Goody's, BC's, all herbal medications, fish oil, and all vitamins    Do not wear jewelry, make-up or nail polish.  Do not wear lotions, powders, or perfumes, or deoderant.  Do not shave 48 hours prior to surgery.  Men may shave face and neck.  Do not bring valuables to the hospital.  [facility] is not responsible for any belongings or valuables.  Contacts, dentures or bridgework may not be worn into surgery.  Leave your suitcase in the car.  After surgery it may be brought to your room.  For patients admitted to the hospital, discharge time will be determined by your treatment team.  Patients discharged the day of surgery will not be allowed to drive home.    Special instructions:   [facility]- Preparing For Surgery  Before surgery, you can play an important role. Because skin is not sterile, your skin needs to be as free of germs as possible. You can reduce the number of germs on your skin by washing with CHG (chlorahexidine gluconate) Soap before surgery.  CHG is an antiseptic cleaner which kills germs and bonds with the  skin to continue killing germs even after washing.  Please do not use if you have an allergy to CHG or antibacterial soaps. If your skin becomes reddened/irritated stop using the CHG.  Do not shave (including legs and underarms) for at least 48 hours prior to first CHG shower. It is OK to shave your face.  Please follow these instructions carefully.   1. Shower the NIGHT BEFORE SURGERY and the MORNING OF SURGERY with CHG.   2. If you chose to wash your hair, wash your hair first as usual with your normal shampoo.  3. After you shampoo, rinse your hair and body thoroughly to remove the shampoo.  4. Use CHG as you would any other liquid soap. You can apply CHG directly to the skin and wash gently with a scrungie or a clean washcloth.   5. Apply the CHG Soap to your body ONLY FROM THE NECK DOWN.  Do not use on open wounds or open sores. Avoid contact with your eyes, ears, mouth and genitals (private parts). Wash genitals (private parts) with your normal soap.  6. Wash thoroughly, paying special attention to the area where your surgery will be performed.  7. Thoroughly rinse your body with warm water from the neck down.  8. DO NOT shower/wash with your normal soap after using and rinsing off the CHG Soap.  9. Pat yourself dry with a CLEAN TOWEL.   10. Wear CLEAN PAJAMAS   11. Place CLEAN SHEETS on your bed the night of your first shower and  DO NOT SLEEP WITH PETS.    Day of Surgery: Do not apply any deodorants/lotions. Please wear clean clothes to the hospital/surgery center.      Please read over the following fact sheets that you were given.

## 2017-03-28 ENCOUNTER — Encounter (HOSPITAL_COMMUNITY)
Admission: RE | Admit: 2017-03-28 | Discharge: 2017-03-28 | Disposition: A | Discharge status: Home / Self Care | Payer: Managed Care, Other (non HMO) | Source: Ambulatory Visit | Attending: General Surgery | Admitting: General Surgery

## 2017-03-28 ENCOUNTER — Encounter (HOSPITAL_COMMUNITY): Payer: Self-pay

## 2017-03-28 DIAGNOSIS — Z79899 Other long term (current) drug therapy: Secondary | ICD-10-CM | POA: Diagnosis not present

## 2017-03-28 DIAGNOSIS — Z01818 Encounter for other preprocedural examination: Secondary | ICD-10-CM

## 2017-03-28 DIAGNOSIS — J449 Chronic obstructive pulmonary disease, unspecified: Secondary | ICD-10-CM

## 2017-03-28 DIAGNOSIS — Z170 Estrogen receptor positive status [ER+]: Secondary | ICD-10-CM | POA: Diagnosis not present

## 2017-03-28 DIAGNOSIS — Z6831 Body mass index (BMI) 31.0-31.9, adult: Secondary | ICD-10-CM | POA: Diagnosis not present

## 2017-03-28 DIAGNOSIS — Z85828 Personal history of other malignant neoplasm of skin: Secondary | ICD-10-CM

## 2017-03-28 DIAGNOSIS — I10 Essential (primary) hypertension: Secondary | ICD-10-CM

## 2017-03-28 DIAGNOSIS — F329 Major depressive disorder, single episode, unspecified: Secondary | ICD-10-CM | POA: Insufficient documentation

## 2017-03-28 DIAGNOSIS — F17200 Nicotine dependence, unspecified, uncomplicated: Secondary | ICD-10-CM | POA: Diagnosis not present

## 2017-03-28 DIAGNOSIS — Z8542 Personal history of malignant neoplasm of other parts of uterus: Secondary | ICD-10-CM

## 2017-03-28 DIAGNOSIS — M549 Dorsalgia, unspecified: Secondary | ICD-10-CM | POA: Diagnosis not present

## 2017-03-28 DIAGNOSIS — N6310 Unspecified lump in the right breast, unspecified quadrant: Secondary | ICD-10-CM

## 2017-03-28 DIAGNOSIS — E785 Hyperlipidemia, unspecified: Secondary | ICD-10-CM

## 2017-03-28 DIAGNOSIS — C50412 Malignant neoplasm of upper-outer quadrant of left female breast: Secondary | ICD-10-CM

## 2017-03-28 DIAGNOSIS — G8929 Other chronic pain: Secondary | ICD-10-CM | POA: Diagnosis not present

## 2017-03-28 DIAGNOSIS — N6489 Other specified disorders of breast: Secondary | ICD-10-CM | POA: Diagnosis not present

## 2017-03-28 DIAGNOSIS — L821 Other seborrheic keratosis: Secondary | ICD-10-CM | POA: Diagnosis not present

## 2017-03-28 DIAGNOSIS — N6091 Unspecified benign mammary dysplasia of right breast: Secondary | ICD-10-CM | POA: Diagnosis not present

## 2017-03-28 DIAGNOSIS — F419 Anxiety disorder, unspecified: Secondary | ICD-10-CM | POA: Diagnosis not present

## 2017-03-28 DIAGNOSIS — Z79891 Long term (current) use of opiate analgesic: Secondary | ICD-10-CM | POA: Diagnosis not present

## 2017-03-28 DIAGNOSIS — C50911 Malignant neoplasm of unspecified site of right female breast: Secondary | ICD-10-CM | POA: Diagnosis present

## 2017-03-28 DIAGNOSIS — C773 Secondary and unspecified malignant neoplasm of axilla and upper limb lymph nodes: Secondary | ICD-10-CM | POA: Diagnosis not present

## 2017-03-28 DIAGNOSIS — Z853 Personal history of malignant neoplasm of breast: Secondary | ICD-10-CM | POA: Insufficient documentation

## 2017-03-28 DIAGNOSIS — Z01812 Encounter for preprocedural laboratory examination: Secondary | ICD-10-CM | POA: Insufficient documentation

## 2017-03-28 HISTORY — DX: Essential (primary) hypertension: I10

## 2017-03-28 HISTORY — DX: Other muscle spasm: M62.838

## 2017-03-28 HISTORY — DX: Stress incontinence (female) (male): N39.3

## 2017-03-28 HISTORY — DX: Dyspnea, unspecified: R06.00

## 2017-03-28 HISTORY — DX: Anxiety disorder, unspecified: F41.9

## 2017-03-28 HISTORY — DX: Emphysema, unspecified: J43.9

## 2017-03-28 HISTORY — DX: Weakness: R53.1

## 2017-03-28 LAB — BASIC METABOLIC PANEL
Anion gap: 10 (ref 5–15)
BUN: 8 mg/dL (ref 6–20)
CO2: 27 mmol/L (ref 22–32)
Calcium: 9.4 mg/dL (ref 8.9–10.3)
Chloride: 103 mmol/L (ref 101–111)
Creatinine, Ser: 0.82 mg/dL (ref 0.44–1.00)
GFR calc Af Amer: >60 mL/min (ref >60)
GFR calc non Af Amer: >60 mL/min (ref >60)
Glucose, Bld: 94 mg/dL (ref 65–99)
Potassium: 4.9 mmol/L (ref 3.5–5.1)
Sodium: 140 mmol/L (ref 135–145)

## 2017-03-28 LAB — CBC
HCT: 45.8 % (ref 36.0–46.0)
Hemoglobin: 15.1 g/dL — ABNORMAL HIGH (ref 12.0–15.0)
MCH: 31.2 pg (ref 26.0–34.0)
MCHC: 33 g/dL (ref 30.0–36.0)
MCV: 94.6 fL (ref 78.0–100.0)
Platelets: 251 10*3/uL (ref 150–400)
RBC: 4.84 MIL/uL (ref 3.87–5.11)
RDW: 13.7 % (ref 11.5–15.5)
WBC: 5.6 10*3/uL (ref 4.0–10.5)

## 2017-03-28 MED ORDER — CHLORHEXIDINE GLUCONATE CLOTH 2 % EX PADS: 6.0000 | MEDICATED_PAD | Freq: Once | CUTANEOUS | Status: DC

## 2017-03-28 NOTE — Progress Notes (Addendum)
Cardiologist denies  Medical Md is Dr.John Nevada Crane  Echo and Stress test reports in epic from 03-05-17  Heart cath denies  EKG in epic from 03-06-17  CXR in epic from 03-05-17

## 2017-03-29 MED ORDER — GABAPENTIN 300 MG PO CAPS
300.0000 mg | ORAL_CAPSULE | ORAL | Status: AC
  Administered 2017-03-30: 300 mg via ORAL
  Filled 2017-03-29: qty 1

## 2017-03-29 MED ORDER — CEFAZOLIN SODIUM-DEXTROSE 2-4 GM/100ML-% IV SOLN
2.0000 g | INTRAVENOUS | Status: AC
  Administered 2017-03-30: 2 g via INTRAVENOUS
  Filled 2017-03-29: qty 100

## 2017-03-29 MED ORDER — CELECOXIB 200 MG PO CAPS
400.0000 mg | ORAL_CAPSULE | ORAL | Status: AC
  Administered 2017-03-30: 400 mg via ORAL
  Filled 2017-03-29: qty 2

## 2017-03-29 NOTE — Progress Notes (Signed)
Anesthesia Chart Review: Patient is a 56 year old female scheduled for left modified radical mastectomy, right simple mastectomy on 03/30/17 by Dr. Dalbert Batman.  History includes smoking, HLD, HTN, anxiety, depression, COPD, DOE, uterine cancer s/p partial hysterectomy, skin cancer, breast cancer. She was hospitalized last month with chest pain and was evaluated by cardiologist Dr. [name]. With upcoming breast surgery planned, stress and echo were ordered (see below).  PCP is Dr. Delphina Cahill.  Meds include albuterol, Xanax, amlodipine, Wellbutrin SR, Cymbalta, Norco, Zocor, Zanaflex, Stiolto Respimat.  BP (!) 129/53   Pulse 64   Temp 36.8 C   Resp 20   Ht 5\' 6"  (1.676 m)   Wt 193 lb 12.8 oz (87.9 kg)   SpO2 95%   BMI 31.28 kg/m   EKG 03/05/17: NSR.  Nuclear stress test 03/05/17: IMPRESSION: 1. No reversible ischemia or infarction. 2. Normal left ventricular wall motion. 3. Left ventricular ejection fraction 56% 4. Non invasive risk stratification*: Low  Echo 03/05/17: Study Conclusions - Left ventricle: The cavity size was normal. Systolic function was   normal. The estimated ejection fraction was in the range of 55%   to 60%. Wall motion was normal; there were no regional wall   motion abnormalities. Doppler parameters are consistent with   abnormal left ventricular relaxation (grade 1 diastolic   dysfunction). Doppler parameters are consistent with   indeterminate ventricular filling pressure. - Aortic valve: Transvalvular velocity was within the normal range.   There was no stenosis. There was no regurgitation. - Mitral valve: Transvalvular velocity was within the normal range.   There was no evidence for stenosis. There was no regurgitation.   Valve area by pressure half-time: 1.73 cm^2. - Right ventricle: The cavity size was normal. Wall thickness was   normal. Systolic function was normal. - Atrial septum: No defect or patent foramen ovale was identified   by color flow  Doppler. - Tricuspid valve: There was no regurgitation.  CXR 03/05/17: IMPRESSION: Bronchial thickening which appears chronic. Streaky left basilar atelectasis.  PFTs 12/29/14: FVC 2.96 (83%), FEV1 1.82 (65%), DLCOunc 8.40 (32%). Interpretation: 1. Spirometry shows mild to moderate obstructive ventilatory defect. No bronchodilator improvement. 2. Lung volumes are mildly reduced. 3. Airway resistance mildly elevated. 4. DLCO severely reduced.  Preoperative labs noted. A1c 03/05/17 was 5.3.  If no acute changes then I anticipate that she could proceed as planned.  [facility] Short Stay Center/Anesthesiology Phone [phone] 03/29/2017 10:03 AM

## 2017-03-29 NOTE — H&P (Signed)
[facility] Location: [facility] Patient #: [id number] DOB: 1961/09/27 Married / Language: English / Race: White Female        History of Present Illness       This is a 56 year old female who returns to discuss surgical management of her locally advanced left breast cancer as well as the right breast mass. [facility] is her PCP. [facility] is going to be her medical oncologist.      She presented in January for workup of a palpable mass in her left breast that she stated had been there for a few months. [facility] worked her up and biopsied a palpable mass in the left breast upper outer quadrant. Calcifications extended 8 cm in diameter. There was a suspicious suspicion of a satellite mass or thickening extending toward the nipple and there was suspicion of multiple axillary lymph nodes. She only allowed biopsy of the primary mass which revealed a grade 2 invasive ductal carcinoma and DCIS, ER 20%, PR 0, Ki-67 15%, HER-2 negative. I saw mass in the right breast and thought was a fibroadenoma or lymph node and recommended 6 month follow-up.       She went to [facility] [place] in [place]. MRI brain, bone scan, CT scan chest abdomen and pelvis, MRI spine, MRI pelvis also showed no metastatic disease. MRI of the breast showed 7 mm nodule right breast, 12 o'clock position, 6 cm from nipple consistent with lymph node or fibroadenoma. The left side there was a 5 cm mass at the 2 o'clock position and a satellite nodule slightly anterior and medial to the primary mass and multiple irregular left axillary lymph nodes.      I initially saw her on February 22, 2017. I advised referral to medical oncology for neoadjuvant chemotherapy. They tried multiple times to call her and were unable to make contact. She called me back and stated that she did not want any further biopsies and she wanted bilateral mastectomies. We discussed her at breast conference this  morning. Consensus viewpoint was that it would be reasonable to proceed with bilateral mastectomies now if that's what she wanted but that from a medical oncology viewpoint she needs either neoadjuvant or adjuvant chemotherapy. She may well need left chest wall radiation. I have discussed all this with her. What she wants to go ahead with bilateral mastectomies which we are going to schedule.      Comorbidities include chronic back pain. Disabled and walks with a cane. Hypertension. Chronic anxiety and depression. Hip pain. COPD. Ongoing tobacco abuse, hyperlipidemia. History of vaginal hysterectomy for benign disease in [place] [facility] 25 years ago. She says she still has her ovaries.      She was hospitalized for 24 hours at [facility] on March 26. Saw [facility] and [facility]. She had chest pain. They ruled out MI. They ruled out PE. Dynamic cardiac imaging was negative. EF 56%. Wall motion normal. No ischemia. Risk stratification low risk.     We had a very long talk today. Emotionally she is better today than she was on March 15. . We spent a long time talking about surgical management. We talked about unilateral versus bilateral mastectomy. We talked about immediate versus delayed reconstruction. She really wants bilateral mastectomy without reconstruction at this time. I think this is most reasonable since she will likely need radiation therapy on the left and she will probably get better symmetry procedure following bilateral versus unilateral mastectomy. She  understands all of this.     She'll be scheduled for left modified radical mastectomy(multiple abnormal nodes) and right simple mastectomy. I discussed the indications, details, techniques, and numerous risk of the surgery with her. She is aware of the risk of bleeding, infection, reoperation for complications, shoulder disability, arm swelling, arm numbness, chronic pain and other unforeseen problems. She knows  that she'll have several drains. She doesn't she'll be hospitalized one or 2 nights. She says that her husband and son will help take care of her. She does he'll be sent to physical therapy postop. She understands all these issues. All of her questions are answered. She agrees with this plan.      She clearly knows that I will urge her to go back to medical oncology and radiation oncology postop. She says that she will keep her appointment with [facility] on April 5.    Allergies  Lyrica *ANTICONVULSANTS*  suicidal  Medication History  ALPRAZolam (0.5MG Tablet, Oral) Active. AmLODIPine Besylate (5MG Tablet, Oral) Active. OxyCODONE HCl (10MG Tablet, Oral three times daily) Active. BuPROPion HCl ER (SR) (200MG Tablet ER 12HR, Oral fs) Active. TiZANidine HCl (4MG Tablet, Oral fs) Active. Simvastatin (40MG Tablet, Oral fs) Active. ProAir HFA (108 (90 Base)MCG/ACT Aerosol Soln, Inhalation as needed) Active. DULoxetine HCl (30MG Capsule DR Part, Oral daily) Active. Hydrocodone-Acetaminophen (7.5-325MG Tablet, Oral daily) Active. Medications Reconciled  Vitals  Weight: 190 lb Height: 65in Body Surface Area: 1.94 m Body Mass Index: 31.62 kg/m  Pulse: 113 (Regular)  BP: 110/80 (Sitting, Left Arm, Standard)   Physical Exam  General Mental Status-Alert. General Appearance-Not in acute distress. Build & Nutrition-Well nourished. Posture-Normal posture. Gait-Normal. Note: Alert. Cooperative. Intermittently tearful but carrying on much more focused and objective conversation today. BMI 31.6 walks with a cane.   Head and Neck Head-normocephalic, atraumatic with no lesions or palpable masses. Trachea-midline. Thyroid Gland Characteristics - normal size and consistency and no palpable nodules.  Chest and Lung Exam Chest and lung exam reveals -on auscultation, normal breath sounds, no adventitious sounds and normal vocal  resonance.  Breast Note: Breasts are large. 3-4 cm palpable mass upper outer quadrant left breast. Very mobile. Skin borderline thickened but no inflammatory change. No discoloration. Nipple and areolar complexes are normal. Left axilla reveals some palpable mobile lymph nodes but not huge. No other masses in either breast. Right axilla negative. Supraclavicular nodes negative. No arm swelling.   Cardiovascular Cardiovascular examination reveals -normal heart sounds, regular rate and rhythm with no murmurs and femoral artery auscultation bilaterally reveals normal pulses, no bruits, no thrills.  Abdomen Inspection Inspection of the abdomen reveals - No Hernias. Palpation/Percussion Palpation and Percussion of the abdomen reveal - Soft, Non Tender, No Rigidity (guarding), No hepatosplenomegaly and No Palpable abdominal masses. Note: No abdominal scars, mass, or hernia.   Neurologic Neurologic evaluation reveals -alert and oriented x 3 with no impairment of recent or remote memory, normal attention span and ability to concentrate, normal sensation and normal coordination.  Musculoskeletal Normal Exam - Bilateral-Upper Extremity Strength Normal and Lower Extremity Strength Normal.    Assessment & Plan   PRIMARY CANCER OF UPPER OUTER QUADRANT OF LEFT FEMALE BREAST (C50.412) We discussed your situation in breast cancer conference this morning Several physicians were present were present The medical oncologists advised chemotherapy, either before surgery or after surgery We have discussed several options You have stated that she would like to go ahead with bilateral mastectomies You have stated that she did not want any  further biopsies  I think it is reasonable to proceed with surgery first You now have an appointment with one of the medical oncologist, [facility], on April 5 Please keep that appointment      We have discussed numerous options including preoperative  chemotherapy, postoperative chemotherapy, adjuvant radiation therapy. We have discussed unilateral mastectomy, bilateral mastectomy, immediate and delayed reconstruction We have reviewed numerous questions that she had. For many reasons, we have decided to schedule you for left modified radical mastectomy and right simple mastectomy I discussed the indications, techniques, and risk of the surgery in detail      We will schedule this at your earliest convenience After you have healed I will encourage you to go back to the [facility] to consider adjuvant chemotherapy You may need radiation therapy to the left chest wall, since you likely have cancer in multiple lymph nodes  BREAST MASS, RIGHT (N63.10) Impression: Seen on mammogram and MRI Follow-up to be most likely benign Patient refuses further biopsies  HISTORY OF VAGINAL HYSTERECTOMY (Z98.890) COPD, MODERATE (J44.9) BMI 31.0-31.9,ADULT (Z68.31) ANXIETY AND DEPRESSION (F41.8) HYPERTENSION, ESSENTIAL (I10) CONTINUOUS TOBACCO ABUSE (Z72.0) CHRONIC BACK PAIN GREATER THAN 3 MONTHS DURATION (M54.9)   Kashawn Dirr M. Dalbert Batman, M.D., [facility] General and Minimally invasive Surgery Breast and Colorectal Surgery Office:   [phone] Pager:   [phone]

## 2017-03-30 ENCOUNTER — Ambulatory Visit (HOSPITAL_COMMUNITY): Payer: Managed Care, Other (non HMO) | Admitting: Certified Registered"

## 2017-03-30 ENCOUNTER — Encounter (HOSPITAL_COMMUNITY): Payer: Self-pay | Admitting: Urology

## 2017-03-30 ENCOUNTER — Ambulatory Visit (HOSPITAL_COMMUNITY): Payer: Managed Care, Other (non HMO) | Admitting: Vascular Surgery

## 2017-03-30 ENCOUNTER — Encounter (HOSPITAL_COMMUNITY)
Admission: RE | Disposition: A | Discharge status: Home / Self Care | Payer: Self-pay | Source: Ambulatory Visit | Attending: General Surgery

## 2017-03-30 ENCOUNTER — Observation Stay (HOSPITAL_COMMUNITY)
Admission: RE | Admit: 2017-03-30 | Discharge: 2017-04-01 | Disposition: A | Discharge status: Home / Self Care | Payer: Managed Care, Other (non HMO) | Source: Ambulatory Visit | Attending: General Surgery | Admitting: General Surgery

## 2017-03-30 DIAGNOSIS — M549 Dorsalgia, unspecified: Secondary | ICD-10-CM | POA: Insufficient documentation

## 2017-03-30 DIAGNOSIS — Z6831 Body mass index (BMI) 31.0-31.9, adult: Secondary | ICD-10-CM | POA: Insufficient documentation

## 2017-03-30 DIAGNOSIS — Z170 Estrogen receptor positive status [ER+]: Secondary | ICD-10-CM | POA: Insufficient documentation

## 2017-03-30 DIAGNOSIS — F329 Major depressive disorder, single episode, unspecified: Secondary | ICD-10-CM | POA: Insufficient documentation

## 2017-03-30 DIAGNOSIS — C50412 Malignant neoplasm of upper-outer quadrant of left female breast: Principal | ICD-10-CM | POA: Diagnosis present

## 2017-03-30 DIAGNOSIS — Z79899 Other long term (current) drug therapy: Secondary | ICD-10-CM | POA: Insufficient documentation

## 2017-03-30 DIAGNOSIS — F419 Anxiety disorder, unspecified: Secondary | ICD-10-CM | POA: Insufficient documentation

## 2017-03-30 DIAGNOSIS — F17200 Nicotine dependence, unspecified, uncomplicated: Secondary | ICD-10-CM | POA: Insufficient documentation

## 2017-03-30 DIAGNOSIS — E785 Hyperlipidemia, unspecified: Secondary | ICD-10-CM | POA: Insufficient documentation

## 2017-03-30 DIAGNOSIS — J449 Chronic obstructive pulmonary disease, unspecified: Secondary | ICD-10-CM | POA: Insufficient documentation

## 2017-03-30 DIAGNOSIS — N6489 Other specified disorders of breast: Secondary | ICD-10-CM | POA: Insufficient documentation

## 2017-03-30 DIAGNOSIS — Z79891 Long term (current) use of opiate analgesic: Secondary | ICD-10-CM | POA: Insufficient documentation

## 2017-03-30 DIAGNOSIS — N6091 Unspecified benign mammary dysplasia of right breast: Secondary | ICD-10-CM | POA: Insufficient documentation

## 2017-03-30 DIAGNOSIS — L821 Other seborrheic keratosis: Secondary | ICD-10-CM | POA: Insufficient documentation

## 2017-03-30 DIAGNOSIS — I10 Essential (primary) hypertension: Secondary | ICD-10-CM | POA: Insufficient documentation

## 2017-03-30 DIAGNOSIS — C773 Secondary and unspecified malignant neoplasm of axilla and upper limb lymph nodes: Secondary | ICD-10-CM | POA: Insufficient documentation

## 2017-03-30 DIAGNOSIS — G8929 Other chronic pain: Secondary | ICD-10-CM | POA: Insufficient documentation

## 2017-03-30 HISTORY — DX: Gout, unspecified: M10.9

## 2017-03-30 HISTORY — DX: Low back pain: M54.5

## 2017-03-30 HISTORY — PX: MASTECTOMY MODIFIED RADICAL: SUR848

## 2017-03-30 HISTORY — DX: Unspecified osteoarthritis, unspecified site: M19.90

## 2017-03-30 HISTORY — PX: MASTECTOMY MODIFIED RADICAL: SHX5962

## 2017-03-30 HISTORY — PX: SIMPLE MASTECTOMY WITH AXILLARY SENTINEL NODE BIOPSY: SHX6098

## 2017-03-30 HISTORY — DX: Other chronic pain: G89.29

## 2017-03-30 HISTORY — DX: Malignant melanoma of other part of trunk: C43.59

## 2017-03-30 HISTORY — DX: Malignant neoplasm of uterus, part unspecified: C55

## 2017-03-30 HISTORY — PX: MASTECTOMY COMPLETE / SIMPLE: SUR845

## 2017-03-30 LAB — CBC
HCT: 44.3 % (ref 36.0–46.0)
Hemoglobin: 14.9 g/dL (ref 12.0–15.0)
MCH: 31.5 pg (ref 26.0–34.0)
MCHC: 33.6 g/dL (ref 30.0–36.0)
MCV: 93.7 fL (ref 78.0–100.0)
Platelets: 253 10*3/uL (ref 150–400)
RBC: 4.73 MIL/uL (ref 3.87–5.11)
RDW: 13.2 % (ref 11.5–15.5)
WBC: 10.6 10*3/uL — ABNORMAL HIGH (ref 4.0–10.5)

## 2017-03-30 LAB — CREATININE, SERUM
Creatinine, Ser: 0.86 mg/dL (ref 0.44–1.00)
GFR calc Af Amer: >60 mL/min (ref >60)
GFR calc non Af Amer: >60 mL/min (ref >60)

## 2017-03-30 SURGERY — MASTECTOMY, MODIFIED RADICAL
Anesthesia: Regional | Site: Breast | Laterality: Right

## 2017-03-30 MED ORDER — PANTOPRAZOLE SODIUM 40 MG IV SOLR
40.0000 mg | Freq: Every day | INTRAVENOUS | Status: DC
  Administered 2017-03-30: 40 mg via INTRAVENOUS
  Filled 2017-03-30: qty 40

## 2017-03-30 MED ORDER — FENTANYL CITRATE (PF) 100 MCG/2ML IJ SOLN
50.0000 ug | Freq: Once | INTRAMUSCULAR | Status: AC
  Administered 2017-03-30: 50 ug via INTRAVENOUS

## 2017-03-30 MED ORDER — ALBUTEROL SULFATE HFA 108 (90 BASE) MCG/ACT IN AERS: 2.0000 | INHALATION_SPRAY | Freq: Four times a day (QID) | RESPIRATORY_TRACT | Status: DC | PRN

## 2017-03-30 MED ORDER — HYDROMORPHONE HCL 1 MG/ML IJ SOLN
1.0000 mg | INTRAMUSCULAR | Status: DC | PRN
  Administered 2017-03-30 – 2017-04-01 (×4): 1 mg via INTRAVENOUS
  Filled 2017-03-30 (×4): qty 1

## 2017-03-30 MED ORDER — OXYCODONE HCL 5 MG/5ML PO SOLN: 5.0000 mg | Freq: Once | ORAL | Status: AC | PRN

## 2017-03-30 MED ORDER — 0.9 % SODIUM CHLORIDE (POUR BTL) OPTIME
TOPICAL | Status: DC | PRN
  Administered 2017-03-30 (×2): 1000 mL

## 2017-03-30 MED ORDER — ONDANSETRON HCL 4 MG/2ML IJ SOLN
INTRAMUSCULAR | Status: AC
  Filled 2017-03-30: qty 2

## 2017-03-30 MED ORDER — LIDOCAINE 2% (20 MG/ML) 5 ML SYRINGE
INTRAMUSCULAR | Status: AC
  Filled 2017-03-30: qty 5

## 2017-03-30 MED ORDER — ROCURONIUM BROMIDE 10 MG/ML (PF) SYRINGE
PREFILLED_SYRINGE | INTRAVENOUS | Status: AC
  Filled 2017-03-30: qty 5

## 2017-03-30 MED ORDER — SUGAMMADEX SODIUM 200 MG/2ML IV SOLN
INTRAVENOUS | Status: DC | PRN
  Administered 2017-03-30: 100 mg via INTRAVENOUS

## 2017-03-30 MED ORDER — BUPIVACAINE HCL (PF) 0.25 % IJ SOLN
INTRAMUSCULAR | Status: DC | PRN
  Administered 2017-03-30 (×2): 15 mL

## 2017-03-30 MED ORDER — PROPOFOL 10 MG/ML IV BOLUS
INTRAVENOUS | Status: AC
  Filled 2017-03-30: qty 20

## 2017-03-30 MED ORDER — METHYLENE BLUE 0.5 % INJ SOLN
INTRAVENOUS | Status: AC
  Filled 2017-03-30: qty 10

## 2017-03-30 MED ORDER — NICOTINE 21 MG/24HR TD PT24
21.0000 mg | MEDICATED_PATCH | Freq: Every day | TRANSDERMAL | Status: DC
  Administered 2017-03-30 – 2017-04-01 (×3): 21 mg via TRANSDERMAL
  Filled 2017-03-30 (×4): qty 1

## 2017-03-30 MED ORDER — OXYCODONE HCL 5 MG PO TABS
5.0000 mg | ORAL_TABLET | Freq: Once | ORAL | Status: AC | PRN
  Administered 2017-03-30: 5 mg via ORAL

## 2017-03-30 MED ORDER — FENTANYL CITRATE (PF) 100 MCG/2ML IJ SOLN
INTRAMUSCULAR | Status: AC
  Administered 2017-03-30: 50 ug via INTRAVENOUS
  Filled 2017-03-30: qty 2

## 2017-03-30 MED ORDER — ACETAMINOPHEN 500 MG PO TABS
1000.0000 mg | ORAL_TABLET | ORAL | Status: AC
  Administered 2017-03-30: 1000 mg via ORAL

## 2017-03-30 MED ORDER — FENTANYL CITRATE (PF) 250 MCG/5ML IJ SOLN
INTRAMUSCULAR | Status: AC
  Filled 2017-03-30: qty 5

## 2017-03-30 MED ORDER — FENTANYL CITRATE (PF) 100 MCG/2ML IJ SOLN
INTRAMUSCULAR | Status: DC | PRN
  Administered 2017-03-30: 25 ug via INTRAVENOUS
  Administered 2017-03-30: 50 ug via INTRAVENOUS
  Administered 2017-03-30 (×3): 25 ug via INTRAVENOUS
  Administered 2017-03-30 (×2): 50 ug via INTRAVENOUS

## 2017-03-30 MED ORDER — SENNA 8.6 MG PO TABS
1.0000 | ORAL_TABLET | Freq: Two times a day (BID) | ORAL | Status: DC
  Administered 2017-03-30 – 2017-04-01 (×4): 8.6 mg via ORAL
  Filled 2017-03-30 (×4): qty 1

## 2017-03-30 MED ORDER — HYDROCODONE-ACETAMINOPHEN 7.5-325 MG PO TABS
1.0000 | ORAL_TABLET | Freq: Four times a day (QID) | ORAL | Status: DC | PRN
  Administered 2017-03-31: 2 via ORAL
  Filled 2017-03-30: qty 2

## 2017-03-30 MED ORDER — DULOXETINE HCL 30 MG PO CPEP
30.0000 mg | ORAL_CAPSULE | Freq: Every day | ORAL | Status: DC
  Administered 2017-03-31 – 2017-04-01 (×2): 30 mg via ORAL
  Filled 2017-03-30 (×2): qty 1

## 2017-03-30 MED ORDER — SIMVASTATIN 40 MG PO TABS
40.0000 mg | ORAL_TABLET | Freq: Every day | ORAL | Status: DC
  Administered 2017-03-30: 40 mg via ORAL
  Filled 2017-03-30: qty 1

## 2017-03-30 MED ORDER — ALPRAZOLAM 0.5 MG PO TABS: 0.5000 mg | ORAL_TABLET | Freq: Every day | ORAL | Status: DC | PRN

## 2017-03-30 MED ORDER — PROPOFOL 10 MG/ML IV BOLUS
INTRAVENOUS | Status: DC | PRN
  Administered 2017-03-30: 140 mg via INTRAVENOUS
  Administered 2017-03-30: 20 mg via INTRAVENOUS

## 2017-03-30 MED ORDER — ACETAMINOPHEN 500 MG PO TABS
ORAL_TABLET | ORAL | Status: AC
  Administered 2017-03-30: 1000 mg via ORAL
  Filled 2017-03-30: qty 2

## 2017-03-30 MED ORDER — MIDAZOLAM HCL 2 MG/2ML IJ SOLN
INTRAMUSCULAR | Status: AC
  Filled 2017-03-30: qty 2

## 2017-03-30 MED ORDER — CEFAZOLIN SODIUM-DEXTROSE 2-4 GM/100ML-% IV SOLN
2.0000 g | Freq: Three times a day (TID) | INTRAVENOUS | Status: AC
  Administered 2017-03-30: 2 g via INTRAVENOUS
  Filled 2017-03-30: qty 100

## 2017-03-30 MED ORDER — TIZANIDINE HCL 2 MG PO TABS
4.0000 mg | ORAL_TABLET | Freq: Three times a day (TID) | ORAL | Status: DC | PRN
  Administered 2017-03-30 – 2017-03-31 (×2): 4 mg via ORAL
  Filled 2017-03-30 (×2): qty 2

## 2017-03-30 MED ORDER — FENTANYL CITRATE (PF) 100 MCG/2ML IJ SOLN
INTRAMUSCULAR | Status: AC
  Filled 2017-03-30: qty 2

## 2017-03-30 MED ORDER — AMLODIPINE BESYLATE 5 MG PO TABS
5.0000 mg | ORAL_TABLET | Freq: Every day | ORAL | Status: DC
Start: 2017-03-31 — End: 2017-04-01
  Administered 2017-03-31 – 2017-04-01 (×2): 5 mg via ORAL
  Filled 2017-03-30 (×2): qty 1

## 2017-03-30 MED ORDER — OXYCODONE HCL 5 MG PO TABS
ORAL_TABLET | ORAL | Status: AC
  Filled 2017-03-30: qty 1

## 2017-03-30 MED ORDER — METHOCARBAMOL 500 MG PO TABS
500.0000 mg | ORAL_TABLET | Freq: Four times a day (QID) | ORAL | Status: DC | PRN
  Administered 2017-03-30 – 2017-03-31 (×3): 500 mg via ORAL
  Filled 2017-03-30 (×3): qty 1

## 2017-03-30 MED ORDER — DEXAMETHASONE SODIUM PHOSPHATE 10 MG/ML IJ SOLN
INTRAMUSCULAR | Status: DC | PRN
  Administered 2017-03-30: 10 mg via INTRAVENOUS

## 2017-03-30 MED ORDER — ENOXAPARIN SODIUM 40 MG/0.4ML ~~LOC~~ SOLN
40.0000 mg | SUBCUTANEOUS | Status: DC
  Administered 2017-03-31 – 2017-04-01 (×2): 40 mg via SUBCUTANEOUS
  Filled 2017-03-30 (×2): qty 0.4

## 2017-03-30 MED ORDER — MIDAZOLAM HCL 5 MG/5ML IJ SOLN
INTRAMUSCULAR | Status: DC | PRN
  Administered 2017-03-30: .5 mg via INTRAVENOUS

## 2017-03-30 MED ORDER — MIDAZOLAM HCL 2 MG/2ML IJ SOLN
2.0000 mg | Freq: Once | INTRAMUSCULAR | Status: AC
  Administered 2017-03-30: 2 mg via INTRAVENOUS

## 2017-03-30 MED ORDER — OXYCODONE HCL 5 MG PO TABS
5.0000 mg | ORAL_TABLET | ORAL | Status: DC | PRN
  Administered 2017-03-30 – 2017-04-01 (×5): 10 mg via ORAL
  Filled 2017-03-30 (×5): qty 2

## 2017-03-30 MED ORDER — BUPIVACAINE-EPINEPHRINE (PF) 0.5% -1:200000 IJ SOLN
INTRAMUSCULAR | Status: DC | PRN
  Administered 2017-03-30 (×2): 15 mL via PERINEURAL

## 2017-03-30 MED ORDER — METHOCARBAMOL 500 MG PO TABS
ORAL_TABLET | ORAL | Status: AC
Start: 2017-03-30 — End: 2017-03-31
  Filled 2017-03-30: qty 1

## 2017-03-30 MED ORDER — BUPROPION HCL ER (SR) 100 MG PO TB12
200.0000 mg | ORAL_TABLET | Freq: Every day | ORAL | Status: DC
  Administered 2017-03-31 – 2017-04-01 (×2): 200 mg via ORAL
  Filled 2017-03-30 (×2): qty 2

## 2017-03-30 MED ORDER — ROCURONIUM BROMIDE 100 MG/10ML IV SOLN
INTRAVENOUS | Status: DC | PRN
  Administered 2017-03-30: 100 mg via INTRAVENOUS

## 2017-03-30 MED ORDER — POTASSIUM CHLORIDE 2 MEQ/ML IV SOLN
INTRAVENOUS | Status: DC
  Administered 2017-03-30 – 2017-03-31 (×2): via INTRAVENOUS
  Filled 2017-03-30 (×4): qty 1000

## 2017-03-30 MED ORDER — PHENYLEPHRINE HCL 10 MG/ML IJ SOLN
INTRAMUSCULAR | Status: DC | PRN
  Administered 2017-03-30: 40 ug via INTRAVENOUS

## 2017-03-30 MED ORDER — LACTATED RINGERS IV SOLN
INTRAVENOUS | Status: DC
  Administered 2017-03-30 (×3): via INTRAVENOUS

## 2017-03-30 MED ORDER — ONDANSETRON HCL 4 MG/2ML IJ SOLN
INTRAMUSCULAR | Status: DC | PRN
  Administered 2017-03-30: 4 mg via INTRAVENOUS

## 2017-03-30 MED ORDER — FENTANYL CITRATE (PF) 100 MCG/2ML IJ SOLN
25.0000 ug | INTRAMUSCULAR | Status: DC | PRN
  Administered 2017-03-30: 50 ug via INTRAVENOUS

## 2017-03-30 MED ORDER — ENSURE ENLIVE PO LIQD
237.0000 mL | Freq: Two times a day (BID) | ORAL | Status: DC
  Administered 2017-03-31 – 2017-04-01 (×3): 237 mL via ORAL

## 2017-03-30 MED ORDER — MIDAZOLAM HCL 2 MG/2ML IJ SOLN
INTRAMUSCULAR | Status: AC
  Administered 2017-03-30: 2 mg via INTRAVENOUS
  Filled 2017-03-30: qty 2

## 2017-03-30 MED ORDER — EPHEDRINE SULFATE 50 MG/ML IJ SOLN
INTRAMUSCULAR | Status: DC | PRN
  Administered 2017-03-30: 10 mg via INTRAVENOUS

## 2017-03-30 MED ORDER — ALBUTEROL SULFATE (2.5 MG/3ML) 0.083% IN NEBU: 2.5000 mg | INHALATION_SOLUTION | Freq: Four times a day (QID) | RESPIRATORY_TRACT | Status: DC | PRN

## 2017-03-30 SURGICAL SUPPLY — 49 items
APPLIER CLIP 9.375 MED OPEN (MISCELLANEOUS) ×6
BENZOIN TINCTURE PRP APPL 2/3 (GAUZE/BANDAGES/DRESSINGS) ×3 IMPLANT
BINDER BREAST LRG (GAUZE/BANDAGES/DRESSINGS) IMPLANT
BINDER BREAST XLRG (GAUZE/BANDAGES/DRESSINGS) ×3 IMPLANT
BIOPATCH RED 1 DISK 7.0 (GAUZE/BANDAGES/DRESSINGS) ×3 IMPLANT
CANISTER SUCT 3000ML PPV (MISCELLANEOUS) ×3 IMPLANT
CHLORAPREP W/TINT 26ML (MISCELLANEOUS) ×3 IMPLANT
CLIP APPLIE 9.375 MED OPEN (MISCELLANEOUS) ×4 IMPLANT
CONT SPEC 4OZ CLIKSEAL STRL BL (MISCELLANEOUS) IMPLANT
COVER SURGICAL LIGHT HANDLE (MISCELLANEOUS) ×3 IMPLANT
DERMABOND ADVANCED (GAUZE/BANDAGES/DRESSINGS) ×1
DERMABOND ADVANCED .7 DNX12 (GAUZE/BANDAGES/DRESSINGS) ×2 IMPLANT
DEVICE DISSECT PLASMABLAD 3.0S (MISCELLANEOUS) IMPLANT
DRAIN CHANNEL 19F RND (DRAIN) ×15 IMPLANT
DRAPE CHEST BREAST 15X10 FENES (DRAPES) ×3 IMPLANT
DRAPE LAPAROSCOPIC ABDOMINAL (DRAPES) ×3 IMPLANT
DRSG ADAPTIC 3X8 NADH LF (GAUZE/BANDAGES/DRESSINGS) ×6 IMPLANT
DRSG PAD ABDOMINAL 8X10 ST (GAUZE/BANDAGES/DRESSINGS) ×3 IMPLANT
ELECT BLADE 4.0 EZ CLEAN MEGAD (MISCELLANEOUS) ×3
ELECT CAUTERY BLADE 6.4 (BLADE) ×3 IMPLANT
ELECT REM PT RETURN 9FT ADLT (ELECTROSURGICAL) ×3
ELECTRODE BLDE 4.0 EZ CLN MEGD (MISCELLANEOUS) ×2 IMPLANT
ELECTRODE REM PT RTRN 9FT ADLT (ELECTROSURGICAL) ×2 IMPLANT
EVACUATOR SILICONE 100CC (DRAIN) ×12 IMPLANT
GAUZE SPONGE 4X4 12PLY STRL (GAUZE/BANDAGES/DRESSINGS) ×3 IMPLANT
GLOVE EUDERMIC 7 POWDERFREE (GLOVE) ×3 IMPLANT
GOWN STRL REUS W/ TWL LRG LVL3 (GOWN DISPOSABLE) ×4 IMPLANT
GOWN STRL REUS W/ TWL XL LVL3 (GOWN DISPOSABLE) ×2 IMPLANT
GOWN STRL REUS W/TWL LRG LVL3 (GOWN DISPOSABLE) ×2
GOWN STRL REUS W/TWL XL LVL3 (GOWN DISPOSABLE) ×1
ILLUMINATOR WAVEGUIDE N/F (MISCELLANEOUS) IMPLANT
KIT BASIN OR (CUSTOM PROCEDURE TRAY) ×3 IMPLANT
KIT ROOM TURNOVER OR (KITS) ×3 IMPLANT
LIGHT WAVEGUIDE WIDE FLAT (MISCELLANEOUS) IMPLANT
NS IRRIG 1000ML POUR BTL (IV SOLUTION) ×6 IMPLANT
PACK GENERAL/GYN (CUSTOM PROCEDURE TRAY) ×3 IMPLANT
PAD ARMBOARD 7.5X6 YLW CONV (MISCELLANEOUS) ×3 IMPLANT
PLASMABLADE 3.0S (MISCELLANEOUS)
SPECIMEN JAR X LARGE (MISCELLANEOUS) ×3 IMPLANT
SPONGE LAP 18X18 X RAY DECT (DISPOSABLE) ×15 IMPLANT
STAPLER VISISTAT 35W (STAPLE) ×3 IMPLANT
STRIP CLOSURE SKIN 1/2X4 (GAUZE/BANDAGES/DRESSINGS) ×3 IMPLANT
SUT ETHILON 3 0 FSL (SUTURE) ×15 IMPLANT
SUT MNCRL AB 4-0 PS2 18 (SUTURE) ×6 IMPLANT
SUT SILK 2 0 FS (SUTURE) ×3 IMPLANT
SUT VIC AB 3-0 SH 18 (SUTURE) ×15 IMPLANT
TOWEL OR 17X24 6PK STRL BLUE (TOWEL DISPOSABLE) ×3 IMPLANT
TOWEL OR 17X26 10 PK STRL BLUE (TOWEL DISPOSABLE) ×3 IMPLANT
TUBE CONNECTING 12X1/4 (SUCTIONS) IMPLANT

## 2017-03-30 NOTE — Anesthesia Procedure Notes (Signed)
Anesthesia Regional Block: Pectoralis block   Pre-Anesthetic Checklist: ,, timeout performed, Correct Patient, Correct Site, Correct Laterality, Correct Procedure, Correct Position, site marked, Risks and benefits discussed,  Surgical consent,  Pre-op evaluation,  At surgeon's request and post-op pain management  Laterality: Right  Prep: chloraprep       Needles:  Injection technique: Single-shot  Needle Type: Echogenic Stimulator Needle          Additional Needles:   Procedures: ultrasound guided,,,,,,,,  Narrative:  Start time: 03/30/2017 1:41 PM End time: 03/30/2017 1:56 PM Injection made incrementally with aspirations every 5 mL.  Performed by: Personally  Anesthesiologist: Tasheika Kitzmiller  Additional Notes: H+P and labs reviewed, risks and benefits discussed with patient, procedure tolerated well without complications

## 2017-03-30 NOTE — Anesthesia Postprocedure Evaluation (Signed)
Anesthesia Post Note  Patient: [facility]  Procedure(s) Performed: Procedure(s) (LRB): LEFT MASTECTOMY MODIFIED RADICAL (Left) RIGHT SIMPLE MASTECTOMY (Right)  Patient location during evaluation: PACU Anesthesia Type: Regional Level of consciousness: awake and alert Pain management: pain level controlled Vital Signs Assessment: post-procedure vital signs reviewed and stable Respiratory status: spontaneous breathing, nonlabored ventilation, respiratory function stable and patient connected to nasal cannula oxygen Cardiovascular status: blood pressure returned to baseline and stable Postop Assessment: no signs of nausea or vomiting Anesthetic complications: no       Last Vitals:  Vitals:   03/30/17 1820 03/30/17 1849  BP: 135/72 131/77  Pulse: 78 83  Resp: 18 18  Temp: 36.7 C 36.5 C    Last Pain:  Vitals:   03/30/17 1922  TempSrc:   PainSc: 3                  Tiajuana Amass

## 2017-03-30 NOTE — Transfer of Care (Signed)
Immediate Anesthesia Transfer of Care Note  Patient: Paizley Ramella  Procedure(s) Performed: Procedure(s): LEFT MASTECTOMY MODIFIED RADICAL (Left) RIGHT SIMPLE MASTECTOMY (Right)  Patient Location: PACU  Anesthesia Type:General and Regional  Level of Consciousness: awake, alert  and oriented but very anxious   Airway & Oxygen Therapy: Patient Spontanous Breathing and Patient connected to nasal cannula oxygen  Post-op Assessment: Report given to RN and Post -op Vital signs reviewed and stable  Post vital signs: Reviewed and stable  Last Vitals:  Vitals:   03/30/17 1350 03/30/17 1704  BP: (!) 123/104 (!) 147/82  Pulse: 72 87  Resp: 16 (!) 30  Temp:  36.2 C    Last Pain:  Vitals:   03/30/17 1227  TempSrc: Oral         Complications: No apparent anesthesia complications

## 2017-03-30 NOTE — Op Note (Addendum)
Patient Name:           [facility]   Date of Surgery:        03/30/2017  Pre op Diagnosis:      Cancer left breast, upper outer quadrant                                       Left axillary adenopathy                                       Right breast mass  Post op Diagnosis:     Same  Procedure:                 Left modified radical mastectomy                                      Right total mastectomy  Surgeon:                     Edsel Petrin. Dalbert Batman, M.D., FACS  Assistant:                     Jackolyn Confer, M.D.   Indication for Assistant: Complex retraction and exposure with patient with very large breasts.  Surgeon assistant indicated to reduce the incidence of intraoperative and postoperative complications and to expedite the procedure in patient with moderately severe COPD.  Operative Indications:   This is a 56 year old female who is brought to the operating room for surgical management of her locally advanced left breast cancer as well as the right breast mass. [facility] is her PCP. [name] is going to be her medical oncologist.      She presented in January for workup of a palpable mass in her left breast that she stated had been there for a few months. [facility] worked her up and biopsied a palpable mass in the left breast upper outer quadrant. Calcifications extended 8 cm in diameter. There was a  suspicion of a satellite mass or thickening extending toward the nipple and there was suspicion of multiple axillary lymph nodes. She only allowed biopsy of the primary mass which revealed a grade 2 invasive ductal carcinoma and DCIS, ER 20%, PR 0, Ki-67 15%, HER-2 negative. They saw mass in the right breast and thought it was a fibroadenoma or lymph node and recommended 6 month follow-up.       She went to [facility] [place] in [place], [place]. MRI brain, bone scan, CT scan chest abdomen and pelvis, MRI spine, MRI pelvis also showed no metastatic  disease. MRI of the breast showed 7 mm nodule right breast, 12 o'clock position, 6 cm from nipple consistent with lymph node or fibroadenoma. The left side there was a 5 cm mass at the 2 o'clock position and a satellite nodule slightly anterior and medial to the primary mass and multiple irregular left axillary lymph nodes.      I initially saw her on February 22, 2017. I advised referral to medical oncology for neoadjuvant chemotherapy. They tried multiple times to call her and were unable to make contact. She called me back and stated that she did not want any further biopsies and  she wanted bilateral mastectomies. We discussed her at breast conference. Consensus viewpoint was that it would be reasonable to proceed with bilateral mastectomies now if that's what she wanted but that from a medical oncology viewpoint she needs either neoadjuvant or adjuvant chemotherapy. She may well need left chest wall radiation. I have discussed all this with her. What she wants to go ahead with bilateral mastectomies which we are going to schedule.  She has been referred to [name] and states that she will keep that appointment      Comorbidities include chronic back pain. Disabled and walks with a cane. Hypertension. Chronic anxiety and depression. Hip pain. COPD. Ongoing tobacco abuse, heavy smoker, hyperlipidemia. History of vaginal hysterectomy for benign disease in [place] 25 years ago. She says she still has her ovaries.     We had a very long talk . Emotionally she is somewhat improved over the past 2 weeks, but still quite anxious.. . We spent a long time talking about surgical management. We talked about unilateral versus bilateral mastectomy. We talked about immediate versus delayed reconstruction. She really wants bilateral mastectomy without reconstruction at this time. I think this is most reasonable since she will likely need radiation therapy on the left and she will probably get better  symmetry procedure following bilateral versus unilateral mastectomy. She also will need to stop smoking if she is to consider reconstruction and she understands that.  She understands all of this.     She'll be scheduled for left modified radical mastectomy(multiple abnormal nodes) and right simple mastectomy. I discussed the indications, details, techniques, and numerous risk of the surgery with her. She is aware of the risk of bleeding, infection, reoperation for complications, shoulder disability, arm swelling, arm numbness, chronic pain and other unforeseen problems. She knows that she'll have several drains. She knows she'll be hospitalized one or 2 nights. She says that her husband and son will help take care of her. She does she'll be sent to physical therapy postop. She understands all these issues. All of her questions are answered. She agrees with this plan.      She clearly knows that I will urge her to go back to medical oncology and radiation oncology postop. She says that she will keep her appointment with [name] on April 5.  Operative Findings:       There is a palpable mass in the upper outer quadrant of the left breast but this is not invading skin or muscle.  There were palpably abnormal lymph nodes in the left axilla, and a couple of these were quite high and touching or just behind the axillary vein.  The right breast was grossly normal  Procedure in Detail:          Upon the induction of general endotracheal anesthesia the patient's upper abdomen entire chest wall shoulders axilla were prepped and draped in a sterile fashion.  Intravenous antibiotics were given.  Surgical timeout was performed.     I spent some time drawing anatomic landmarks and transverse elliptical incision so as to avoid redundant skin.  I will dictate the surgical technique once because it was almost identical on each side so far is that mastectomy is concerned.     On each side I made a transverse  elliptical incision as planned.  Skin flaps were raised superiorly to the infraclavicular area, medially to the parasternal area, inferiorly to the anterior rectus sheath and laterally to the anterior border latissimus dorsi muscle.  On each side the breast were dissected off of the pectoralis major and minor muscles.     The right breast was marked with a silk suture to mark the lateral skin margin and passed off the field.     On the left side I carried the dissection up into the axilla and performed a level one and level II lymph node dissection.  There was some edema on the left in the axilla.  I carried the dissection up until I identified the axillary vein.  Superficial venous tributaries were controlled with multiple metal clips and divided.  One of the sensory nerves was divided between metal clips.  I swept all of the axilla contents out of the left axilla.  There are some nodes high up near the axillary vein but were not invading the vein and these were removed with the specimen.  The thoracodorsal neurovascular bundle and the long thoracic nerve were identified and preserved and they were functioning at the end of the case.  The lateral skin margins were marked with silk sutures.  The left modified radical mastectomy specimen was passed off the field.        I irrigated both mastectomy wounds.  Hemostasis was excellent and had been achieved with electrocautery, metal clips, and Vicryl ties.  I placed two 19 French Blake drains on each side.  One up into the axillary area and one across the skin flaps.  These were brought out through separate stab incisions inferolaterally and sutured to the skin and connected to suction bulbs.  The incisions were closed in 2 layers.  Inner layer of interrupted 3-0 Vicryl subcutaneous stitches.  The skin was closed with a running subcuticular 4-0 Monocryl and Dermabond.  At the completion of the cases the skin flaps looked very good without any evidence of ischemia or  necrosis.  The wounds were airtight and the suction bulbs held the charge.  The wounds were cleansed and dried, covered with dry gauze bandages and a breast binder.  The patient tolerated the procedure well was taken to PACU in stable condition.  EBL 250 mL or less.  Counts correct.  Complications none.          Edsel Petrin. Dalbert Batman, M.D., FACS General and Minimally Invasive Surgery Breast and Colorectal Surgery  03/30/2017 4:54 PM

## 2017-03-30 NOTE — Anesthesia Preprocedure Evaluation (Signed)
Anesthesia Evaluation  Patient identified by MRN, date of birth, ID band Patient awake    Reviewed: Allergy & Precautions, NPO status , Patient's Chart, lab work & pertinent test results  History of Anesthesia Complications Negative for: history of anesthetic complications  Airway Mallampati: II  TM Distance: >3 FB Neck ROM: Full    Dental  (+) Edentulous Upper, Edentulous Lower   Pulmonary shortness of breath, COPD,  COPD inhaler, Current Smoker,     + decreased breath sounds      Cardiovascular hypertension, Pt. on medications (-) angina(-) Past MI and (-) CHF  Rhythm:Regular     Neuro/Psych PSYCHIATRIC DISORDERS Anxiety Depression negative neurological ROS     GI/Hepatic negative GI ROS, Neg liver ROS,   Endo/Other  Morbid obesity  Renal/GU negative Renal ROS     Musculoskeletal   Abdominal   Peds  Hematology negative hematology ROS (+)   Anesthesia Other Findings   Reproductive/Obstetrics                             Anesthesia Physical Anesthesia Plan  ASA: III  Anesthesia Plan: General and Regional   Post-op Pain Management:  Regional for Post-op pain   Induction: Intravenous  Airway Management Planned: Oral ETT  Additional Equipment: None  Intra-op Plan:   Post-operative Plan: Extubation in OR  Informed Consent: I have reviewed the patients History and Physical, chart, labs and discussed the procedure including the risks, benefits and alternatives for the proposed anesthesia with the patient or authorized representative who has indicated his/her understanding and acceptance.   Dental advisory given  Plan Discussed with: CRNA and Surgeon  Anesthesia Plan Comments:         Anesthesia Quick Evaluation

## 2017-03-30 NOTE — Anesthesia Procedure Notes (Signed)
Anesthesia Regional Block: Pectoralis block   Pre-Anesthetic Checklist: ,, timeout performed, Correct Patient, Correct Site, Correct Laterality, Correct Procedure, Correct Position, site marked, Risks and benefits discussed,  Surgical consent,  Pre-op evaluation,  At surgeon's request and post-op pain management  Laterality: Left  Prep: chloraprep       Needles:  Injection technique: Single-shot  Needle Type: Echogenic Stimulator Needle          Additional Needles:   Procedures: ultrasound guided,,,,,,,,  Narrative:  Start time: 03/30/2017 1:41 PM End time: 03/30/2017 1:59 PM Injection made incrementally with aspirations every 5 mL.  Performed by: Personally  Anesthesiologist: Muranda Coye  Additional Notes: H+P and labs reviewed, risks and benefits discussed with patient, procedure tolerated well without complications

## 2017-03-30 NOTE — Progress Notes (Signed)
1845 received pt from PACU, A&O x4, c/o surgical pain, medicated.  Bil chest dressing dry and intact. 4 JP drains intact and compressed. Call light w/in reach. Endorsed.

## 2017-03-30 NOTE — Anesthesia Procedure Notes (Signed)
Procedure Name: Intubation Date/Time: 03/30/2017 2:11 PM Performed by: Shirlyn Goltz Pre-anesthesia Checklist: Patient identified, Emergency Drugs available, Suction available and Patient being monitored Patient Re-evaluated:Patient Re-evaluated prior to inductionOxygen Delivery Method: Circle system utilized Preoxygenation: Pre-oxygenation with 100% oxygen Intubation Type: IV induction Laryngoscope Size: Mac and 3 Grade View: Grade I Tube type: Oral Tube size: 7.0 mm Number of attempts: 1 Airway Equipment and Method: Stylet

## 2017-03-31 ENCOUNTER — Encounter (HOSPITAL_COMMUNITY): Payer: Self-pay | Admitting: General Surgery

## 2017-03-31 DIAGNOSIS — C50412 Malignant neoplasm of upper-outer quadrant of left female breast: Secondary | ICD-10-CM | POA: Diagnosis not present

## 2017-03-31 MED ORDER — ATORVASTATIN CALCIUM 20 MG PO TABS
20.0000 mg | ORAL_TABLET | Freq: Every day | ORAL | Status: DC
  Administered 2017-03-31: 20 mg via ORAL
  Filled 2017-03-31: qty 1

## 2017-03-31 MED ORDER — PANTOPRAZOLE SODIUM 40 MG PO TBEC
40.0000 mg | DELAYED_RELEASE_TABLET | Freq: Every day | ORAL | Status: DC
  Administered 2017-03-31: 40 mg via ORAL
  Filled 2017-03-31: qty 1

## 2017-03-31 NOTE — Progress Notes (Signed)
1 Day Post-Op  Subjective: Pain well controlled. Slept well overnight. Tolerated breakfast.  Objective: Vital signs in last 24 hours: Temp:  [97.2 F (36.2 C)-99.2 F (37.3 C)] 98.2 F (36.8 C) (04/21 0430) Pulse Rate:  [69-87] 84 (04/21 0430) Resp:  [11-30] 18 (04/21 0430) BP: (108-147)/(61-104) 124/72 (04/21 0430) SpO2:  [91 %-97 %] 92 % (04/21 0430) Weight:  [87.5 kg (193 lb)] 87.5 kg (193 lb) (04/20 1227) Last BM Date: 03/30/17  Intake/Output from previous day: 04/20 0701 - 04/21 0700 In: 3528.3 [P.O.:300; I.V.:3228.3] Out: 655 [Drains:355; Blood:300] Intake/Output this shift: No intake/output data recorded.  General appearance: alert and cooperative Chest wall: no tenderness, no seroma/hematoma. JP drains serosanguinous bilaterally.  GI: soft, non-tender; bowel sounds normal; no masses,  no organomegaly Incision/Wound: c/d/I with dermabond  Lab Results:   Recent Labs  03/28/17 1133 03/30/17 1900  WBC 5.6 10.6*  HGB 15.1* 14.9  HCT 45.8 44.3  PLT 251 253   BMET  Recent Labs  03/28/17 1133 03/30/17 1900  NA 140  --   K 4.9  --   CL 103  --   CO2 27  --   GLUCOSE 94  --   BUN 8  --   CREATININE 0.82 0.86  CALCIUM 9.4  --    PT/INR No results for input(s): LABPROT, INR in the last 72 hours. ABG No results for input(s): PHART, HCO3 in the last 72 hours.  Invalid input(s): PCO2, PO2  Studies/Results: No results found.  Anti-infectives: Anti-infectives    Start     Dose/Rate Route Frequency Ordered Stop   03/30/17 1930  ceFAZolin (ANCEF) IVPB 2g/100 mL premix     2 g 200 mL/hr over 30 Minutes Intravenous Every 8 hours 03/30/17 1721 03/30/17 2002   03/30/17 0600  ceFAZolin (ANCEF) IVPB 2g/100 mL premix     2 g 200 mL/hr over 30 Minutes Intravenous On call to O.R. 03/29/17 0834 03/30/17 1420      Assessment/Plan: s/p Procedure(s): LEFT MASTECTOMY MODIFIED RADICAL (Left) RIGHT SIMPLE MASTECTOMY (Right) Plan for discharge tomorrow Patient  requesting to go to gift shop- ok to go if accompanied by staff  LOS: 0 days    Margaret James 03/31/2017

## 2017-03-31 NOTE — Progress Notes (Signed)
Initial Nutrition Assessment  DOCUMENTATION CODES:  Obesity unspecified  INTERVENTION:  Ensure Enlive po BID, each supplement provides 350 kcal and 20 grams of protein  Recommend diet liberalization to regular given cancer dx and recent surgery.   Showed how to utilize menu  NUTRITION DIAGNOSIS:  Increased nutrient needs related to wound healing and cancer as evidenced by estimated nutritional requirements for this outcome and this chronic illness  GOAL:  Patient will meet greater than or equal to 90% of their needs  MONITOR:  PO intake, Supplement acceptance, Diet advancement, Labs  REASON FOR ASSESSMENT:  Malnutrition Screening Tool    ASSESSMENT:  56 y/o female PMHx COPD, HLD, HTN, Anxiety, Depression, Uterine Cancer, Tobacco abuse and locally advanced breast cancer, now s/p Bilateral mastectomies.   Pt is POD1. Pain is well controlled. Seen up in chair in NAD.   She had reported on MST loss of ~ 6lbs   She has only been eating 1x a day, however, this has been her normal eating pattern for the forseeable past. She only eats 1x a day because "I have no appetite"/ Her daughter, who is at bedside, says she has to force the patient to eat. She claims the issue is that the patient "drinks coffee throughout the whole day and everyone knows coffee is an appetite suppressant". She puts large amounts of cream and sugar in her coffee.   She says her UBW is in the 140s (???), but "I gained up to around 200 due to my medications". There is very little documented wt history.   Given the fact her eating habits have been very stable, RD asked if she has had an increase in stress/anxiety related to her recent cancer Dx. She believes she has, though she thinks she has had cancer for 14 years.   RD discussed the need to eat more than 1x a day to help her new wound heal. She should also start trying to eat more at home if she will be undergoing further cancer tx. She says she has a good  appetite now. RD showed menu and how to order. She was agreeable to supplements.   Physical Exam: WDL  Labs: WBC: 10.6, Lipid panel results noted, very poor Medications: Ensure Enlive, Senna, Lactated Ringers IVF, PRN pain meds   Recent Labs Lab 03/28/17 1133 03/30/17 1900  NA 140  --   K 4.9  --   CL 103  --   CO2 27  --   BUN 8  --   CREATININE 0.82 0.86  CALCIUM 9.4  --   GLUCOSE 94  --     Diet Order:  Diet Heart Room service appropriate? Yes; Fluid consistency: Thin  Skin: New surgical incisions to chest  Last BM:  4/20  Height:  Ht Readings from Last 1 Encounters:  03/28/17 5\' 6"  (1.676 m)   Weight:  Wt Readings from Last 1 Encounters:  03/30/17 193 lb (87.5 kg)   Wt Readings from Last 10 Encounters:  03/30/17 193 lb (87.5 kg)  03/28/17 193 lb 12.8 oz (87.9 kg)  03/05/17 191 lb 14.4 oz (87 kg)   Ideal Body Weight:  59.1 kg  BMI:  Body mass index is 31.15 kg/m.  Estimated Nutritional Needs:  Kcal:  1750-2000 kcals (20-23 kcal/kg bw) Protein:  77-89 g (1.3-1.5 g/kg bw) Fluid:  1.8-2 L fluid  EDUCATION NEEDS:  No education needs identified at this time  Burtis Junes RD, LDN, [facility] Pager: [phone] 03/31/2017 11:16 AM

## 2017-03-31 NOTE — Progress Notes (Signed)
Patient reports increased swelling and warmth to right shoulder and breast. Assessment confirmed swelling to right should and breast. BLE elevated and VSS (98.4 - 123/65 - 65 - 16 - 96% room air). Dr  Donne Hazel notified.

## 2017-04-01 DIAGNOSIS — C50412 Malignant neoplasm of upper-outer quadrant of left female breast: Secondary | ICD-10-CM | POA: Diagnosis not present

## 2017-04-01 MED ORDER — ENSURE ENLIVE PO LIQD: 237.0000 mL | Freq: Two times a day (BID) | ORAL | 0 refills | Status: AC

## 2017-04-01 MED ORDER — OXYCODONE HCL 5 MG PO TABS: 5.0000 mg | ORAL_TABLET | ORAL | 0 refills | Status: DC | PRN

## 2017-04-01 MED ORDER — HYDROCODONE-ACETAMINOPHEN 7.5-325 MG PO TABS: 1.0000 | ORAL_TABLET | Freq: Four times a day (QID) | ORAL | 0 refills | Status: DC | PRN

## 2017-04-01 MED ORDER — METHOCARBAMOL 500 MG PO TABS: 500.0000 mg | ORAL_TABLET | Freq: Four times a day (QID) | ORAL | 0 refills | Status: AC | PRN

## 2017-04-01 NOTE — Discharge Summary (Signed)
Physician Discharge Summary  Patient ID: Margaret James MRN: [id number] DOB/AGE: 1961/05/03 56 y.o.  Admit date: 03/30/2017 Discharge date: 04/01/2017  Admission Diagnoses: breast cancer  Discharge Diagnoses:  Principal Problem:   Breast cancer of upper-outer quadrant of left female breast [facility]) Active Problems:   Primary cancer of upper outer quadrant of left breast [facility])   Discharged Condition: good  Hospital Course: She was admitted for observation and supportive care following bilateral mastectomy with left axillary lymph node dissection. She did very well. Her pain was well controlled with oral medications. Her drain output was serosanguinous. Her labs on post-op day 1 were stable. On POD 2 she was eager to go home and was clinically stable for discharge  Consults: None  Significant Diagnostic Studies: labs: see epic  Treatments: IV hydration, analgesia and surgery: as above  Discharge Exam: Blood pressure 126/66, pulse (!) 57, temperature 98.1 F (36.7 C), temperature source Oral, resp. rate 18, weight 87.5 kg (193 lb), SpO2 91 %. General appearance: alert and cooperative Resp: clear to auscultation bilaterally Chest wall: no tenderness, flaps viable without underlying fluid GI: soft, non-tender; bowel sounds normal; no masses,  no organomegaly Incision/Wound: c/d/I with dermabond  Disposition: 01-Home or Self Care  Discharge Instructions    Diet - low sodium heart healthy    Complete by:  As directed    Increase activity slowly    Complete by:  As directed      Allergies as of 04/01/2017      Reactions   Lyrica [pregabalin] Other (See Comments)   homocidal/suicidal      Medication List    TAKE these medications   albuterol 108 (90 Base) MCG/ACT inhaler Commonly known as:  PROVENTIL HFA;VENTOLIN HFA Inhale 2 puffs into the lungs every 6 (six) hours as needed for wheezing or shortness of breath.   ALPRAZolam 0.5 MG tablet Commonly known as:  XANAX Take  0.5 mg by mouth daily as needed for anxiety.   amLODipine 5 MG tablet Commonly known as:  NORVASC Take 1 tablet (5 mg total) by mouth daily.   buPROPion 200 MG 12 hr tablet Commonly known as:  WELLBUTRIN SR Take 200 mg by mouth daily.   DULoxetine 30 MG capsule Commonly known as:  CYMBALTA Take 30 mg by mouth daily.   feeding supplement (ENSURE ENLIVE) Liqd Take 237 mLs by mouth 2 (two) times daily between meals.   GINKOBA PO Take 2 tablets by mouth daily.   HYDROcodone-acetaminophen 7.5-325 MG tablet Commonly known as:  NORCO Take 1-2 tablets by mouth every 6 (six) hours as needed (pain). What changed:  Another medication with the same name was added. Make sure you understand how and when to take each.   HYDROcodone-acetaminophen 7.5-325 MG tablet Commonly known as:  NORCO Take 1-2 tablets by mouth every 6 (six) hours as needed (pain). What changed:  You were already taking a medication with the same name, and this prescription was added. Make sure you understand how and when to take each.   methocarbamol 500 MG tablet Commonly known as:  ROBAXIN Take 1 tablet (500 mg total) by mouth every 6 (six) hours as needed for muscle spasms.   Oxycodone HCl 10 MG Tabs Take 10 mg by mouth every 4 (four) hours as needed for pain. What changed:  Another medication with the same name was added. Make sure you understand how and when to take each.   oxyCODONE 5 MG immediate release tablet Commonly known as:  Oxy IR/ROXICODONE  Take 1-2 tablets (5-10 mg total) by mouth every 4 (four) hours as needed for moderate pain. What changed:  You were already taking a medication with the same name, and this prescription was added. Make sure you understand how and when to take each.   simvastatin 40 MG tablet Commonly known as:  ZOCOR Take 40 mg by mouth at bedtime.   STIOLTO RESPIMAT 2.5-2.5 MCG/ACT Aers Generic drug:  Tiotropium Bromide-Olodaterol Inhale 2 puffs into the lungs daily.    tiZANidine 4 MG tablet Commonly known as:  ZANAFLEX Take 4 mg by mouth every 8 (eight) hours as needed for muscle spasms.      Follow-up Information    Adin Hector, MD. Call in 2 day(s).   Specialty:  General Surgery Contact information: [address] [place] [address] [phone]           Signed: Clovis Riley 04/01/2017, 12:32 PM

## 2017-04-01 NOTE — Progress Notes (Signed)
2 Days Post-Op  Subjective: Doing well. Wants to go home. Swelling of right shoulder relieved by loosening binder.   Objective: Vital signs in last 24 hours: Temp:  [98.1 F (36.7 C)] 98.1 F (36.7 C) (04/22 0602) Pulse Rate:  [57-80] 57 (04/22 0602) Resp:  [18] 18 (04/22 0602) BP: (106-126)/(66-67) 126/66 (04/22 0602) SpO2:  [91 %-95 %] 91 % (04/22 0602) Last BM Date: 03/30/17  Intake/Output from previous day: 04/21 0701 - 04/22 0700 In: 420 [P.O.:420] Out: 415 [Drains:415] Intake/Output this shift: No intake/output data recorded.  General appearance: alert and cooperative Chest wall: no tenderness, no seroma/hematoma. JP drains serosanguinous bilaterally.  GI: soft, non-tender; bowel sounds normal; no masses,  no organomegaly Incision/Wound: c/d/I with dermabond drains serosang  Lab Results:   Recent Labs  03/30/17 1900  WBC 10.6*  HGB 14.9  HCT 44.3  PLT 253   BMET  Recent Labs  03/30/17 1900  CREATININE 0.86   PT/INR No results for input(s): LABPROT, INR in the last 72 hours. ABG No results for input(s): PHART, HCO3 in the last 72 hours.  Invalid input(s): PCO2, PO2  Studies/Results: No results found.  Anti-infectives: Anti-infectives    Start     Dose/Rate Route Frequency Ordered Stop   03/30/17 1930  ceFAZolin (ANCEF) IVPB 2g/100 mL premix     2 g 200 mL/hr over 30 Minutes Intravenous Every 8 hours 03/30/17 1721 03/30/17 2002   03/30/17 0600  ceFAZolin (ANCEF) IVPB 2g/100 mL premix     2 g 200 mL/hr over 30 Minutes Intravenous On call to O.R. 03/29/17 0834 03/30/17 1420      Assessment/Plan: s/p Procedure(s): LEFT MASTECTOMY MODIFIED RADICAL (Left) RIGHT SIMPLE MASTECTOMY (Right) Discharge today  LOS: 0 days    Clovis Riley 04/01/2017

## 2017-04-01 NOTE — Progress Notes (Signed)
Discharge instructions, RX's , and follow up appts explained and provided to patient verbalized understanding. Patient left floor via wheelchair accompanied by staff no c/o pain or shortness of breath at discharge.  Traves Majchrzak, Tivis Ringer, RN

## 2017-04-01 NOTE — Discharge Instructions (Signed)
[facility] °[phone] ° °MASTECTOMY: POST OP INSTRUCTIONS ° °Always review your discharge instruction sheet given to you by the facility where your surgery was performed. °IF YOU HAVE DISABILITY OR FAMILY LEAVE FORMS, YOU MUST BRING THEM TO THE OFFICE FOR PROCESSING.   °DO NOT GIVE THEM TO YOUR DOCTOR. °A prescription for pain medication may be given to you upon discharge.  Take your pain medication as prescribed, if needed.  If narcotic pain medicine is not needed, then you may take acetaminophen (Tylenol) or ibuprofen (Advil) as needed. °1. Take your usually prescribed medications unless otherwise directed. °2. If you need a refill on your pain medication, please contact your pharmacy.  They will contact our office to request authorization.  Prescriptions will not be filled after 5pm or on week-ends. °3. You should follow a light diet the first few days after arrival home, such as soup and crackers, etc.  Resume your normal diet the day after surgery. °4. Most patients will experience some swelling and bruising on the chest and underarm.  Ice packs will help.  Swelling and bruising can take several days to resolve.  °5. It is common to experience some constipation if taking pain medication after surgery.  Increasing fluid intake and taking a stool softener (such as Colace) will usually help or prevent this problem from occurring.  A mild laxative (Milk of Magnesia or Miralax) should be taken according to package instructions if there are no bowel movements after 48 hours. °6. Unless discharge instructions indicate otherwise, leave your bandage dry and in place until your next appointment in 3-5 days.  You may take a limited sponge bath.  No tube baths or showers until the drains are removed.  You may have steri-strips (small skin tapes) in place directly over the incision.  These strips should be left on the skin for 7-10 days.  If your surgeon used skin glue on the incision, you may  shower in 24 hours.  The glue will flake off over the next 2-3 weeks.  Any sutures or staples will be removed at the office during your follow-up visit. °7. DRAINS:  If you have drains in place, it is important to keep a list of the amount of drainage produced each day in your drains.  Before leaving the hospital, you should be instructed on drain care.  Call our office if you have any questions about your drains. °8. ACTIVITIES:  You may resume regular (light) daily activities beginning the next day--such as daily self-care, walking, climbing stairs--gradually increasing activities as tolerated.  You may have sexual intercourse when it is comfortable.  Refrain from any heavy lifting or straining until approved by your doctor. °a. You may drive when you are no longer taking prescription pain medication, you can comfortably wear a seatbelt, and you can safely maneuver your car and apply brakes. °b. RETURN TO WORK:  __________________________________________________________ °9. You should see your doctor in the office for a follow-up appointment approximately 3-5 days after your surgery.  Your doctor’s nurse will typically make your follow-up appointment when she calls you with your pathology report.  Expect your pathology report 2-3 business days after your surgery.  You may call to check if you do not hear from us after three days.   °10. OTHER INSTRUCTIONS: ______________________________________________________________________________________________ ____________________________________________________________________________________________ °WHEN TO CALL YOUR DOCTOR: °1. Fever over 101.0 °2. Nausea and/or vomiting °3. Extreme swelling or bruising °4. Continued bleeding from incision. °5. Increased pain, redness, or drainage from the incision. °  The clinic staff is available to answer your questions during regular business hours.  Please don’t hesitate to call and ask to speak to one of the nurses for clinical  concerns.  If you have a medical emergency, go to the nearest emergency room or call 911.  A surgeon from [facility] is always on call at the hospital. °[address], [address], [place], [place]  [address] ? [address] [address], [place], [place]   [address] °[phone] ? [phone] ? FAX [phone] °Web site: [email or web address] °

## 2017-04-09 ENCOUNTER — Encounter: Payer: Self-pay | Admitting: Hematology and Oncology

## 2017-04-09 ENCOUNTER — Telehealth: Payer: Self-pay | Admitting: Hematology and Oncology

## 2017-04-09 NOTE — Telephone Encounter (Signed)
Cld the pt to schedule an appt. Appt has been scheduled for the pt to see Dr. Lindi Adie on 5/7 at 345pm. Lft the pt a vm w/appt date and time. Letter mailed.

## 2017-04-10 ENCOUNTER — Encounter: Payer: Self-pay | Admitting: Radiation Oncology

## 2017-04-10 NOTE — Progress Notes (Addendum)
Location of Breast Cancer:Left Breast Upper Outer Quadrant  Histology per Pathology Report:  Diagnosis 01/30/2017: Breast, left, needle core biopsy, upper outer 2:00 9 cm fn - INVASIVE DUCTAL CARCINOMA, SEE COMMENT.- DUCTAL CARCINOMA IN SITU.  Receptor Status: ER(20%+), PR (neg 0%), Her2-neu (neg), Ki-67(15%)  Did patient present with symptoms (if so, please note symptoms) or was this found on screening mammography?:   Past/Anticipated interventions by surgeon, if JZP:[id number] 03/30/2017: Dr. Renelda Loma Ingram,MD  1. Breast, simple mastectomy, Right - COMPLEX SCLEROSING LESION WITH USUAL DUCTAL HYPERPLASIA AND CALCIFICATIONS. - FIBROCYSTIC CHANGES WITH CALCIFICATIONS AND SCLEROSING ADENOSIS.-PSEUDOANGIOMATOUS STROMAL HYPERPLASIA ([place]).- NO EVIDENCE OF MALIGNANCY.- FIVE BENIGN LYMPH NODES (0/5). 2. Breast, modified radical mastectomy , Left - INVASIVE AND IN SITU DUCTAL CARCINOMA, 3.2 CM.- MARGINS NOT INVOLVED.- PREVIOUS BIOPSY SITE. - FIBROCYSTIC CHANGES WITH USUAL DUCTAL HYPERPLASIA AND SCLEROSING ADENOSIS. - METASTATIC CARCINOMA IN THREE OF EIGHTEEN LYMPH NODES (3/18) WITH FOCAL EXTRANODAL EXTENSION.- SEBORRHEIC KERATOSIS (2).   Past/Anticipated interventions by medical oncology, if any: Chemotherapy :[name] appt 4/5/218  Lymphedema issues, if any:    Pain issues, if any: Hip pain, chronic back pain > 3 months  SAFETY ISSUES: yes walks with  Cane,disabled  Prior radiation? NO  Pacemaker/ICD? NO  Possible current pregnancy? NO  Is the patient on methotrexate? NO  Current Complaints / other details:COPD, HTN,Chronic anxiety/depression,  HX Melanoma on back    Rebecca Eaton, RN 04/10/2017,1:19 PM

## 2017-04-11 ENCOUNTER — Encounter: Payer: Self-pay | Admitting: Radiation Oncology

## 2017-04-16 ENCOUNTER — Ambulatory Visit: Payer: Managed Care, Other (non HMO)

## 2017-04-16 ENCOUNTER — Ambulatory Visit: Payer: Managed Care, Other (non HMO) | Admitting: Hematology and Oncology

## 2017-04-16 ENCOUNTER — Ambulatory Visit
Admission: RE | Admit: 2017-04-16 | Discharge: 2017-04-16 | Disposition: A | Discharge status: Home / Self Care | Payer: Managed Care, Other (non HMO) | Source: Ambulatory Visit | Attending: Radiation Oncology | Admitting: Radiation Oncology

## 2017-04-16 NOTE — Assessment & Plan Note (Deleted)
Left mastectomy 03/30/2017:  IDC 3.2 cm, DCIS, margins negative, 3/18 lymph nodes positive with focal extranodal extension, grade 2, ER 20%, PR 0%, HER-2 negative ratio 1.13, Ki-67 15% stage IIB  Right mastectomy: Complex sclerosing lesion with UDH, PASH, 0/5 LN Neg   Pathology counseling: I discussed the final pathology report of the patient provided  a copy of this report. I discussed the margins as well as lymph node surgeries. We also discussed the final staging along with previously performed ER/PR and HER-2/neu testing.  Recommendation: 1. Patient would most likely benefit from adjuvant systemic chemotherapy. We also discussed the role of Mammaprint in determining the risk of recurrence as well as in helping with decision regarding chemotherapy. 2. if she is high risk on Mammaprint, then we will administer systemic chemotherapy with dose dense Adriamycin and Cytoxan followed by Taxol weekly 12 3. If she is low risk, she may not benefit from adjuvant chemotherapy. 4. This will be followed by adjuvant radiation 5. Followed by adjuvant antiestrogen therapy.  Return to clinic based on Mammaprint test results.

## 2017-04-17 ENCOUNTER — Ambulatory Visit: Admission: RE | Admit: 2017-04-17 | Payer: Managed Care, Other (non HMO) | Source: Ambulatory Visit

## 2017-04-17 ENCOUNTER — Telehealth: Payer: Self-pay | Admitting: *Deleted

## 2017-04-17 ENCOUNTER — Ambulatory Visit
Admission: RE | Admit: 2017-04-17 | Payer: Managed Care, Other (non HMO) | Source: Ambulatory Visit | Admitting: Radiation Oncology

## 2017-04-17 NOTE — Telephone Encounter (Signed)
Called and left vm, asking if patient still coming, or if held up in traffic, if not coming for her 930am appt,please call to reschedule 9:41 AM

## 2017-04-18 NOTE — Assessment & Plan Note (Deleted)
Left mastectomy 03/30/2017:  IDC 3.2 cm, DCIS, margins negative, 3/18 lymph nodes positive with focal extranodal extension, grade 2, ER 20%, PR 0%, HER-2 negative ratio 1.13, Ki-67 15% stage IIB  Right mastectomy: Complex sclerosing lesion with UDH, PASH, 0/5 LN Neg   Pathology counseling: I discussed the final pathology report of the patient provided  a copy of this report. I discussed the margins as well as lymph node surgeries. We also discussed the final staging along with previously performed ER/PR and HER-2/neu testing.  Recommendation: 1. Patient would most likely benefit from adjuvant systemic chemotherapy. We also discussed the role of Mammaprint in determining the risk of recurrence as well as in helping with decision regarding chemotherapy. 2. if she is high risk on Mammaprint, then we will administer systemic chemotherapy with dose dense Adriamycin and Cytoxan followed by Taxol weekly 12 3. If she is low risk, she may not benefit from adjuvant chemotherapy. 4. This will be followed by adjuvant radiation 5. Followed by adjuvant antiestrogen therapy.  Return to clinic based on Mammaprint test results.

## 2017-04-19 ENCOUNTER — Encounter: Payer: Self-pay | Admitting: Radiation Oncology

## 2017-04-19 ENCOUNTER — Ambulatory Visit: Payer: Managed Care, Other (non HMO) | Admitting: Hematology and Oncology

## 2017-04-19 ENCOUNTER — Inpatient Hospital Stay: Admission: RE | Admit: 2017-04-19 | Payer: Managed Care, Other (non HMO) | Source: Ambulatory Visit

## 2017-04-19 ENCOUNTER — Ambulatory Visit
Admission: RE | Admit: 2017-04-19 | Payer: Managed Care, Other (non HMO) | Source: Ambulatory Visit | Admitting: Radiation Oncology

## 2017-04-19 ENCOUNTER — Telehealth: Payer: Self-pay | Admitting: *Deleted

## 2017-04-19 NOTE — Telephone Encounter (Signed)
Called and left vm asking if patient was on her way here at [place] [facility] for rad inc consult, or was she held up in traffic ,or if not coming, please call to reschedule with [place] at [phone] 12:43 PM

## 2017-04-19 NOTE — Progress Notes (Deleted)
[facility] CONSULT NOTE  Patient Care Team: Celene Squibb, MD as PCP - General (Internal Medicine)  CHIEF COMPLAINTS/PURPOSE OF CONSULTATION:  Newly diagnosed breast cancer  HISTORY OF PRESENTING ILLNESS:  Margaret James 56 y.o. female is here because of recent diagnosis of left breast cancer. Patient felt a lump in the left breast which was evaluated for mammogram. There was a mass in the left breast at 2:00 position measuring 2.1 cm along with a 6 mm satellite nodule. This was biopsy-proven to be invasive ductal carcinoma with DCIS. Subsequently patient underwent bilateral mastectomies on 03/30/2017. Right breast was benign with radial scar and left breast showed grade 2 invasive ductal carcinoma 3.2 cm with 3/18 lymph nodes positive. ER weak 20% PR 0% HER-2 negative and a Ki-67 of 15%. She has been referred to was for discussion regarding adjuvant treatment options. She is healing very well from the surgery. The drains are still in place.  I reviewed her records extensively and collaborated the history with the patient.  SUMMARY OF ONCOLOGIC HISTORY:   Breast cancer of upper-outer quadrant of left female breast ([place])   01/02/2017 Mammogram    Diagnostic mammogram and US  IMPRESSION: 1. Findings highly suspicious for malignancy in the upper outer quadrant of the left breast, measuring 1.8 x 2.1 x 1.6 cm. Medially adjacent to this mass is a 6 mm oval nearly anechoic nodule which could be a cyst or tumor nodule   2. A suspicious left axillary lymph node. 3. Probable fibroadenoma upper-outer quadrant right breast at 10 o'clock. 4. Probably benign calcifications in the right breast.      01/30/2017 Initial Biopsy    Breast, left, needle core biopsy, upper outer 2:00 9 cm fn - INVASIVE DUCTAL CARCINOMA, SEE COMMENT. - DUCTAL CARCINOMA IN SITU. Microscopic Comment The carcinoma appears grade 2.      01/30/2017 Receptors her2    ER 20%+, weak staining, PR-, HER2-, Ki67  15%      03/30/2017 Surgery    Left mastectomy:  IDC 3.2 cm, DCIS, margins negative, 3/18 lymph nodes positive with focal extranodal extension, grade 2, ER 20%, PR 0%, HER-2 negative ratio 1.13, Ki-67 15% stage IIB Right mastectomy: Complex sclerosing lesion with UDH, PASH, 0/5 LN Neg       MEDICAL HISTORY:  Past Medical History:  Diagnosis Date  . Anxiety    takes Xanax daily as needed  . Arthritis    "knees, back" (03/30/2017)  . Breast cancer ([place])    Left, ER positive IDC with DCIS   . Chronic lower back pain    DDD  . COPD (chronic obstructive pulmonary disease) ([facility])    Stiolto daily and Albuterol as needed  . Depression    takes Cymbalta and Wellbutrin daily  . Dyspnea    with exertion  . Emphysema lung ([place])   . Gout   . HLD (hyperlipidemia)    takes Simvastatin daily  . Hypertension    takes Amlodipine daily  . Melanoma of back ([place])   . Muscle spasm    takes Zanaflex daily as needed   . Stress incontinence   . Tobacco abuse   . Uterine cancer ([place])   . Weakness    numbness in hands    SURGICAL HISTORY: Past Surgical History:  Procedure Laterality Date  . BREAST BIOPSY Left 12/2016  . MASTECTOMY COMPLETE / SIMPLE Right 03/30/2017  . MASTECTOMY MODIFIED RADICAL Left 03/30/2017  . MASTECTOMY MODIFIED RADICAL Left 03/30/2017  Procedure: LEFT MASTECTOMY MODIFIED RADICAL;  Surgeon: Fanny Skates, MD;  Location: [facility];  Service: General;  Laterality: Left;  Marland Kitchen MELANOMA EXCISION     "back"  . SIMPLE MASTECTOMY WITH AXILLARY SENTINEL NODE BIOPSY Right 03/30/2017   Procedure: RIGHT SIMPLE MASTECTOMY;  Surgeon: Fanny Skates, MD;  Location: [facility];  Service: General;  Laterality: Right;  . TONSILLECTOMY  1977  . VAGINAL HYSTERECTOMY      SOCIAL HISTORY: Social History   Social History  . Marital status: Married    Spouse name: N/A  . Number of children: N/A  . Years of education: N/A   Occupational History  . Not on file.   Social History Main Topics   . Smoking status: Current Every Day Smoker    Packs/day: 1.50    Years: 41.00    Types: Cigarettes  . Smokeless tobacco: Never Used  . Alcohol use No  . Drug use: Yes    Types: Marijuana     Comment: 03/30/2017 "03/29/17 last dose; use it 2-3 times/week"   . Sexual activity: Not on file   Other Topics Concern  . Not on file   Social History Narrative  . No narrative on file    FAMILY HISTORY: Family History  Problem Relation Age of Onset  . COPD Mother   . Heart disease Father   . Lung cancer Sister     ALLERGIES:  is allergic to lyrica [pregabalin].  MEDICATIONS:  Current Outpatient Prescriptions  Medication Sig Dispense Refill  . albuterol (PROVENTIL HFA;VENTOLIN HFA) 108 (90 Base) MCG/ACT inhaler Inhale 2 puffs into the lungs every 6 (six) hours as needed for wheezing or shortness of breath.    . ALPRAZolam (XANAX) 0.5 MG tablet Take 0.5 mg by mouth daily as needed for anxiety.    Marland Kitchen amLODipine (NORVASC) 5 MG tablet Take 1 tablet (5 mg total) by mouth daily. 30 tablet 0  . buPROPion (WELLBUTRIN SR) 200 MG 12 hr tablet Take 200 mg by mouth daily.    . DULoxetine (CYMBALTA) 30 MG capsule Take 30 mg by mouth daily.    . Ginkgo Biloba (GINKOBA PO) Take 2 tablets by mouth daily.    Marland Kitchen HYDROcodone-acetaminophen (NORCO) 7.5-325 MG tablet Take 1-2 tablets by mouth every 6 (six) hours as needed (pain).     Marland Kitchen HYDROcodone-acetaminophen (NORCO) 7.5-325 MG tablet Take 1-2 tablets by mouth every 6 (six) hours as needed (pain). 30 tablet 0  . oxyCODONE (OXY IR/ROXICODONE) 5 MG immediate release tablet Take 1-2 tablets (5-10 mg total) by mouth every 4 (four) hours as needed for moderate pain. 30 tablet 0  . Oxycodone HCl 10 MG TABS Take 10 mg by mouth every 4 (four) hours as needed for pain.    . simvastatin (ZOCOR) 40 MG tablet Take 40 mg by mouth at bedtime.    . Tiotropium Bromide-Olodaterol (STIOLTO RESPIMAT) 2.5-2.5 MCG/ACT AERS Inhale 2 puffs into the lungs daily.    Marland Kitchen tiZANidine  (ZANAFLEX) 4 MG tablet Take 4 mg by mouth every 8 (eight) hours as needed for muscle spasms.      No current facility-administered medications for this visit.     REVIEW OF SYSTEMS:   Constitutional: Denies fevers, chills or abnormal night sweats Eyes: Denies blurriness of vision, double vision or watery eyes Ears, nose, mouth, throat, and face: Denies mucositis or sore throat Respiratory: Denies cough, dyspnea or wheezes Cardiovascular: Denies palpitation, chest discomfort or lower extremity swelling Gastrointestinal:  Denies nausea, heartburn or change in  bowel habits Skin: Denies abnormal skin rashes Lymphatics: Denies new lymphadenopathy or easy bruising Neurological:Denies numbness, tingling or new weaknesses Behavioral/Psych: Mood is stable, no new changes  Breast: Recent bilateral mastectomies All other systems were reviewed with the patient and are negative.  PHYSICAL EXAMINATION: ECOG PERFORMANCE STATUS: 1 - Symptomatic but completely ambulatory  There were no vitals filed for this visit. There were no vitals filed for this visit.  GENERAL:alert, no distress and comfortable SKIN: skin color, texture, turgor are normal, no rashes or significant lesions EYES: normal, conjunctiva are pink and non-injected, sclera clear OROPHARYNX:no exudate, no erythema and lips, buccal mucosa, and tongue normal  NECK: supple, thyroid normal size, non-tender, without nodularity LYMPH:  no palpable lymphadenopathy in the cervical, axillary or inguinal LUNGS: clear to auscultation and percussion with normal breathing effort HEART: regular rate & rhythm and no murmurs and no lower extremity edema ABDOMEN:abdomen soft, non-tender and normal bowel sounds Musculoskeletal:no cyanosis of digits and no clubbing  PSYCH: alert & oriented x 3 with fluent speech NEURO: no focal motor/sensory deficits  LABORATORY DATA:  I have reviewed the data as listed Lab Results  Component Value Date   WBC 10.6  (H) 03/30/2017   HGB 14.9 03/30/2017   HCT 44.3 03/30/2017   MCV 93.7 03/30/2017   PLT 253 03/30/2017   Lab Results  Component Value Date   NA 140 03/28/2017   K 4.9 03/28/2017   CL 103 03/28/2017   CO2 27 03/28/2017    RADIOGRAPHIC STUDIES: I have personally reviewed the radiological reports and agreed with the findings in the report.  ASSESSMENT AND PLAN:  Breast cancer of upper-outer quadrant of left female breast ([place]) Left mastectomy 03/30/2017:  IDC 3.2 cm, DCIS, margins negative, 3/18 lymph nodes positive with focal extranodal extension, grade 2, ER 20%, PR 0%, HER-2 negative ratio 1.13, Ki-67 15% stage IIB  Right mastectomy: Complex sclerosing lesion with UDH, PASH, 0/5 LN Neg   Pathology counseling: I discussed the final pathology report of the patient provided  a copy of this report. I discussed the margins as well as lymph node surgeries. We also discussed the final staging along with previously performed ER/PR and HER-2/neu testing.  Recommendation: 1. Patient would most likely benefit from adjuvant systemic chemotherapy. We also discussed the role of Mammaprint in determining the risk of recurrence as well as in helping with decision regarding chemotherapy. 2. if she is high risk on Mammaprint, then we will administer systemic chemotherapy with dose dense Adriamycin and Cytoxan followed by Taxol weekly 12 3. If she is low risk, she may not benefit from adjuvant chemotherapy. 4. This will be followed by adjuvant radiation 5. Followed by adjuvant antiestrogen therapy.  Mammaprint counseling: [facility]DA[facility] is a prospective, randomized phase III controlled trial that investigates the clinical utility of MammaPrint, when compared to standard clinical pathological criteria, with 6,693 patients enrolled from over 111 institutions. Clinical high-risk patients with a Low Risk MammaPrint result, including 48% node-positive, had 5-year distant metastasis-free survival rate in excess of  94 percent, whether randomized to receive adjuvant chemotherapy or not proving MammaPrint's ability to safely identify Low Risk patients.  Return to clinic based on Mammaprint test results.    All questions were answered. The patient knows to call the clinic with any problems, questions or concerns.    Rulon Eisenmenger, MD 04/19/17

## 2017-04-19 NOTE — Telephone Encounter (Signed)
Spoke to pt concerning missed appt with Dr. Lindi Adie. Stressed the importance of coming to see medical oncology. Scheduled and confirmed new appt with Dr. Lindi Adie on 5/11 at 0900. Denies further questions at this time.

## 2017-04-20 ENCOUNTER — Encounter: Payer: Self-pay | Admitting: *Deleted

## 2017-04-20 ENCOUNTER — Telehealth: Payer: Self-pay | Admitting: *Deleted

## 2017-04-20 ENCOUNTER — Encounter: Payer: Self-pay | Admitting: Hematology and Oncology

## 2017-04-20 ENCOUNTER — Ambulatory Visit (HOSPITAL_BASED_OUTPATIENT_CLINIC_OR_DEPARTMENT_OTHER): Payer: Managed Care, Other (non HMO) | Admitting: Hematology and Oncology

## 2017-04-20 DIAGNOSIS — Z170 Estrogen receptor positive status [ER+]: Secondary | ICD-10-CM

## 2017-04-20 DIAGNOSIS — C50412 Malignant neoplasm of upper-outer quadrant of left female breast: Secondary | ICD-10-CM | POA: Diagnosis not present

## 2017-04-20 NOTE — Telephone Encounter (Signed)
Received order for mammaprint testing. Requisition sent to pathology. Received by Keisha. 

## 2017-04-20 NOTE — Progress Notes (Signed)
[facility] CONSULT NOTE  Patient Care Team: Celene Squibb, MD as PCP - General (Internal Medicine)  CHIEF COMPLAINTS/PURPOSE OF CONSULTATION:  Newly diagnosed breast cancer  HISTORY OF PRESENTING ILLNESS:  Margaret James 56 y.o. female is here because of recent diagnosis of left breast cancer. She underwent a mammogram on 01/02/2017 which revealed upper outer quadrant left breast 2.1 cm lesion. There was a suspicious left axillary lymph node as well. Biopsy revealed invasive ductal carcinoma with DCIS grade 2 that was ER/PR positive HER-2 negative. She underwent bilateral mastectomies 03/30/2017 and final pathology revealed a 3.2 cm IDC with DCIS 3 out of 18 lymph nodes were positive. She was ER 20% positive weak staining PR negative HER-2 negative with a Ki-67 15%. We discussed her case in the multidisciplinary tumor board and she is here today to discuss adjuvant treatment plan. She is accompanied by her daughter.  I reviewed her records extensively and collaborated the history with the patient.  SUMMARY OF ONCOLOGIC HISTORY:   Breast cancer of upper-outer quadrant of left female breast ([place])   01/02/2017 Mammogram    Diagnostic mammogram and US  IMPRESSION: 1. Findings highly suspicious for malignancy in the upper outer quadrant of the left breast, measuring 1.8 x 2.1 x 1.6 cm. Medially adjacent to this mass is a 6 mm oval nearly anechoic nodule which could be a cyst or tumor nodule   2. A suspicious left axillary lymph node. 3. Probable fibroadenoma upper-outer quadrant right breast at 10 o'clock. 4. Probably benign calcifications in the right breast.      01/30/2017 Initial Biopsy    Breast, left, needle core biopsy, upper outer 2:00 9 cm fn - INVASIVE DUCTAL CARCINOMA, SEE COMMENT. - DUCTAL CARCINOMA IN SITU. Microscopic Comment The carcinoma appears grade 2.      01/30/2017 Receptors her2    ER 20%+, weak staining, PR-, HER2-, Ki67 15%      03/30/2017  Surgery    Left mastectomy:  IDC 3.2 cm, DCIS, margins negative, 3/18 lymph nodes positive with focal extranodal extension, grade 2, ER 20%, PR 0%, HER-2 negative ratio 1.13, Ki-67 15% stage IIB Right mastectomy: Complex sclerosing lesion with UDH, PASH, 0/5 LN Neg        MEDICAL HISTORY:  Past Medical History:  Diagnosis Date  . Anxiety    takes Xanax daily as needed  . Arthritis    "knees, back" (03/30/2017)  . Breast cancer ([place])    Left, ER positive IDC with DCIS   . Chronic lower back pain    DDD  . COPD (chronic obstructive pulmonary disease) ([facility])    Stiolto daily and Albuterol as needed  . Depression    takes Cymbalta and Wellbutrin daily  . Dyspnea    with exertion  . Emphysema lung ([place])   . Gout   . HLD (hyperlipidemia)    takes Simvastatin daily  . Hypertension    takes Amlodipine daily  . Melanoma of back ([place])   . Muscle spasm    takes Zanaflex daily as needed   . Stress incontinence   . Tobacco abuse   . Uterine cancer ([place])   . Weakness    numbness in hands    SURGICAL HISTORY: Past Surgical History:  Procedure Laterality Date  . BREAST BIOPSY Left 12/2016  . MASTECTOMY COMPLETE / SIMPLE Right 03/30/2017  . MASTECTOMY MODIFIED RADICAL Left 03/30/2017  . MASTECTOMY MODIFIED RADICAL Left 03/30/2017   Procedure: LEFT MASTECTOMY MODIFIED RADICAL;  Surgeon:  Fanny Skates, MD;  Location: [facility];  Service: General;  Laterality: Left;  Marland Kitchen MELANOMA EXCISION     "back"  . SIMPLE MASTECTOMY WITH AXILLARY SENTINEL NODE BIOPSY Right 03/30/2017   Procedure: RIGHT SIMPLE MASTECTOMY;  Surgeon: Fanny Skates, MD;  Location: [facility];  Service: General;  Laterality: Right;  . TONSILLECTOMY  1977  . VAGINAL HYSTERECTOMY      SOCIAL HISTORY: Social History   Social History  . Marital status: Married    Spouse name: N/A  . Number of children: N/A  . Years of education: N/A   Occupational History  . Not on file.   Social History Main Topics  . Smoking status:  Current Every Day Smoker    Packs/day: 1.50    Years: 41.00    Types: Cigarettes  . Smokeless tobacco: Never Used  . Alcohol use No  . Drug use: Yes    Types: Marijuana     Comment: 03/30/2017 "03/29/17 last dose; use it 2-3 times/week"   . Sexual activity: Not on file   Other Topics Concern  . Not on file   Social History Narrative  . No narrative on file    FAMILY HISTORY: Family History  Problem Relation Age of Onset  . COPD Mother   . Heart disease Father   . Lung cancer Sister     ALLERGIES:  is allergic to lyrica [pregabalin].  MEDICATIONS:  Current Outpatient Prescriptions  Medication Sig Dispense Refill  . albuterol (PROVENTIL HFA;VENTOLIN HFA) 108 (90 Base) MCG/ACT inhaler Inhale 2 puffs into the lungs every 6 (six) hours as needed for wheezing or shortness of breath.    . ALPRAZolam (XANAX) 0.5 MG tablet Take 0.5 mg by mouth daily as needed for anxiety.    Marland Kitchen amLODipine (NORVASC) 5 MG tablet Take 1 tablet (5 mg total) by mouth daily. 30 tablet 0  . buPROPion (WELLBUTRIN SR) 200 MG 12 hr tablet Take 200 mg by mouth daily.    . DULoxetine (CYMBALTA) 30 MG capsule Take 30 mg by mouth daily.    . Ginkgo Biloba (GINKOBA PO) Take 2 tablets by mouth daily.    Marland Kitchen HYDROcodone-acetaminophen (NORCO) 7.5-325 MG tablet Take 1-2 tablets by mouth every 6 (six) hours as needed (pain).     Marland Kitchen HYDROcodone-acetaminophen (NORCO) 7.5-325 MG tablet Take 1-2 tablets by mouth every 6 (six) hours as needed (pain). 30 tablet 0  . oxyCODONE (OXY IR/ROXICODONE) 5 MG immediate release tablet Take 1-2 tablets (5-10 mg total) by mouth every 4 (four) hours as needed for moderate pain. 30 tablet 0  . Oxycodone HCl 10 MG TABS Take 10 mg by mouth every 4 (four) hours as needed for pain.    . simvastatin (ZOCOR) 40 MG tablet Take 40 mg by mouth at bedtime.    . Tiotropium Bromide-Olodaterol (STIOLTO RESPIMAT) 2.5-2.5 MCG/ACT AERS Inhale 2 puffs into the lungs daily.    Marland Kitchen tiZANidine (ZANAFLEX) 4 MG tablet  Take 4 mg by mouth every 8 (eight) hours as needed for muscle spasms.      No current facility-administered medications for this visit.     REVIEW OF SYSTEMS:   Constitutional: Denies fevers, chills or abnormal night sweats Eyes: Denies blurriness of vision, double vision or watery eyes Ears, nose, mouth, throat, and face: Denies mucositis or sore throat Respiratory: Denies cough, dyspnea or wheezes Cardiovascular: Denies palpitation, chest discomfort or lower extremity swelling Gastrointestinal:  Denies nausea, heartburn or change in bowel habits Skin: Denies abnormal skin rashes  Lymphatics: Denies new lymphadenopathy or easy bruising Neurological:Denies numbness, tingling or new weaknesses Behavioral/Psych: Mood is stable, no new changes  Breast: Bilateral mastectomies All other systems were reviewed with the patient and are negative.  PHYSICAL EXAMINATION: ECOG PERFORMANCE STATUS: 1 - Symptomatic but completely ambulatory  Vitals:   04/20/17 0941  BP: 110/83  Pulse: 87  Resp: 20  Temp: 98.1 F (36.7 C)   Filed Weights   04/20/17 0941  Weight: 187 lb 9.6 oz (85.1 kg)    GENERAL:alert, no distress and comfortable SKIN: skin color, texture, turgor are normal, no rashes or significant lesions EYES: normal, conjunctiva are pink and non-injected, sclera clear OROPHARYNX:no exudate, no erythema and lips, buccal mucosa, and tongue normal  NECK: supple, thyroid normal size, non-tender, without nodularity LYMPH:  no palpable lymphadenopathy in the cervical, axillary or inguinal LUNGS: clear to auscultation and percussion with normal breathing effort HEART: regular rate & rhythm and no murmurs and no lower extremity edema ABDOMEN:abdomen soft, non-tender and normal bowel sounds Musculoskeletal:no cyanosis of digits and no clubbing  PSYCH: alert & oriented x 3 with fluent speech NEURO: no focal motor/sensory deficits   LABORATORY DATA:  I have reviewed the data as listed Lab  Results  Component Value Date   WBC 10.6 (H) 03/30/2017   HGB 14.9 03/30/2017   HCT 44.3 03/30/2017   MCV 93.7 03/30/2017   PLT 253 03/30/2017   Lab Results  Component Value Date   NA 140 03/28/2017   K 4.9 03/28/2017   CL 103 03/28/2017   CO2 27 03/28/2017    RADIOGRAPHIC STUDIES: I have personally reviewed the radiological reports and agreed with the findings in the report.  ASSESSMENT AND PLAN:  Breast cancer of upper-outer quadrant of left female breast ([place]) Left mastectomy 03/30/2017:  IDC 3.2 cm, DCIS, margins negative, 3/18 lymph nodes positive with focal extranodal extension, grade 2, ER 20%, PR 0%, HER-2 negative ratio 1.13, Ki-67 15% stage IIB  Right mastectomy: Complex sclerosing lesion with UDH, PASH, 0/5 LN Neg   Pathology counseling: I discussed the final pathology report of the patient provided  a copy of this report. I discussed the margins as well as lymph node surgeries. We also discussed the final staging along with previously performed ER/PR and HER-2/neu testing.  Recommendation: 1. Patient would most likely benefit from adjuvant systemic chemotherapy. We also discussed the role of Mammaprint in determining the risk of recurrence as well as in helping with decision regarding chemotherapy. 2. if she is high risk on Mammaprint, then we will administer systemic chemotherapy with dose dense Adriamycin and Cytoxan followed by Taxol weekly 12 3. If she is low risk, she may not benefit from adjuvant chemotherapy. 4. This will be followed by adjuvant radiation 5. Followed by adjuvant antiestrogen therapy.  Return to clinic based on Mammaprint test results.     All questions were answered. The patient knows to call the clinic with any problems, questions or concerns.    Rulon Eisenmenger, MD 04/20/17

## 2017-04-20 NOTE — Progress Notes (Signed)
Received message from patient needing assistance with sleeve. Advised patient we will be able to take care of that for her. Called [employer] [facility](Debbie) to advise we will be covering through the [employer] funds and to fax [place] the invoice. She verbalized understanding and will contact the patient when sleeve is ready for pick-up.

## 2017-04-20 NOTE — Assessment & Plan Note (Signed)
Left mastectomy 03/30/2017:  IDC 3.2 cm, DCIS, margins negative, 3/18 lymph nodes positive with focal extranodal extension, grade 2, ER 20%, PR 0%, HER-2 negative ratio 1.13, Ki-67 15% stage IIB  Right mastectomy: Complex sclerosing lesion with UDH, PASH, 0/5 LN Neg   Pathology counseling: I discussed the final pathology report of the patient provided  a copy of this report. I discussed the margins as well as lymph node surgeries. We also discussed the final staging along with previously performed ER/PR and HER-2/neu testing.  Recommendation: 1. Patient would most likely benefit from adjuvant systemic chemotherapy. We also discussed the role of Mammaprint in determining the risk of recurrence as well as in helping with decision regarding chemotherapy. 2. if she is high risk on Mammaprint, then we will administer systemic chemotherapy with dose dense Adriamycin and Cytoxan followed by Taxol weekly 12 3. If she is low risk, she may not benefit from adjuvant chemotherapy. 4. This will be followed by adjuvant radiation 5. Followed by adjuvant antiestrogen therapy.  Return to clinic based on Mammaprint test results.

## 2017-04-30 ENCOUNTER — Telehealth: Payer: Self-pay | Admitting: *Deleted

## 2017-04-30 ENCOUNTER — Encounter (HOSPITAL_COMMUNITY): Payer: Self-pay

## 2017-04-30 NOTE — Telephone Encounter (Signed)
Received Mammaprint score of High Risk.  Placed a copy in Dr. Geralyn Flash chair, gave Varney Biles a copy and took a copy to HIM to scan.

## 2017-04-30 NOTE — Telephone Encounter (Signed)
Left message for a return phone call for an appointment to discuss mammaprint results.

## 2017-05-01 ENCOUNTER — Telehealth: Payer: Self-pay | Admitting: *Deleted

## 2017-05-01 ENCOUNTER — Ambulatory Visit (HOSPITAL_BASED_OUTPATIENT_CLINIC_OR_DEPARTMENT_OTHER): Payer: Managed Care, Other (non HMO) | Admitting: Hematology and Oncology

## 2017-05-01 ENCOUNTER — Encounter: Payer: Self-pay | Admitting: Hematology and Oncology

## 2017-05-01 DIAGNOSIS — C50412 Malignant neoplasm of upper-outer quadrant of left female breast: Secondary | ICD-10-CM | POA: Diagnosis not present

## 2017-05-01 DIAGNOSIS — Z170 Estrogen receptor positive status [ER+]: Secondary | ICD-10-CM | POA: Diagnosis not present

## 2017-05-01 NOTE — Assessment & Plan Note (Signed)
Left mastectomy 03/30/2017:  IDC 3.2 cm, DCIS, margins negative, 3/18 lymph nodes positive with focal extranodal extension, grade 2, ER 20%, PR 0%, HER-2 negative ratio 1.13, Ki-67 15% stage IIB  Right mastectomy: Complex sclerosing lesion with UDH, PASH, 0/5 LN Neg   Mammaprint Result: High risk: Average 56 year old risk of recurrence untreated is a 29% without systemic chemotherapy With chemotherapy patient has a 94.6% benefit in terms of distant metastasis free survival.  Basal type  Treatment plan: Adjuvant chemotherapy with dose dense Adriamycin and Cytoxan 4 followed by Taxol weekly 12  Chemotherapy Counseling: I discussed the risks and benefits of chemotherapy including the risks of nausea/ vomiting, risk of infection from low WBC count, fatigue due to chemo or anemia, bruising or bleeding due to low platelets, mouth sores, loss/ change in taste and decreased appetite. Liver and kidney function will be monitored through out chemotherapy as abnormalities in liver and kidney function may be a side effect of treatment. Cardiac dysfunction due to Adriamycin were discussed in detail. Risk of permanent bone marrow dysfunction and leukemia due to chemo were also discussed.  Return to clinic to start chemotherapy

## 2017-05-01 NOTE — Progress Notes (Signed)
Patient Care Team: Celene Squibb, MD as PCP - General (Internal Medicine)  DIAGNOSIS:  Encounter Diagnosis  Name Primary?  . Malignant neoplasm of upper-outer quadrant of left breast in female, estrogen receptor positive ([place])     SUMMARY OF ONCOLOGIC HISTORY:   Breast cancer of upper-outer quadrant of left female breast ([facility])   01/02/2017 Mammogram    Diagnostic mammogram and US  IMPRESSION: 1. Findings highly suspicious for malignancy in the upper outer quadrant of the left breast, measuring 1.8 x 2.1 x 1.6 cm. Medially adjacent to this mass is a 6 mm oval nearly anechoic nodule which could be a cyst or tumor nodule   2. A suspicious left axillary lymph node. 3. Probable fibroadenoma upper-outer quadrant right breast at 10 o'clock. 4. Probably benign calcifications in the right breast.      01/30/2017 Initial Biopsy    Breast, left, needle core biopsy, upper outer 2:00 9 cm fn - INVASIVE DUCTAL CARCINOMA, SEE COMMENT. - DUCTAL CARCINOMA IN SITU. Microscopic Comment The carcinoma appears grade 2.      01/30/2017 Receptors her2    ER 20%+, weak staining, PR-, HER2-, Ki67 15%      03/30/2017 Surgery    Left mastectomy:  IDC 3.2 cm, DCIS, margins negative, 3/18 lymph nodes positive with focal extranodal extension, grade 2, ER 20%, PR 0%, HER-2 negative ratio 1.13, Ki-67 15% stage IIB Right mastectomy: Complex sclerosing lesion with UDH, PASH, 0/5 LN Neg       04/24/2017 Pathology Results    Mammaprint testing: High risk, 10 year risk of recurrence untreated 29%; 5 year distant metastases free interval 94.6% with came on hormonal therapy, basal type       CHIEF COMPLIANT: follow-up to discuss Mammaprint test results  INTERVAL HISTORY: Margaret James is a 56 year old with above-mentioned history of left breast cancer who underwent bilateral mastectomies. She is here to discuss the results of Mammaprint test. Mammaprint came back as high risk indicating that she would  benefit from systemic chemotherapy. She has recovered very well from the surgeries and is without any major pain or discomfort at this time.  REVIEW OF SYSTEMS:   Constitutional: Denies fevers, chills or abnormal weight loss Eyes: Denies blurriness of vision Ears, nose, mouth, throat, and face: Denies mucositis or sore throat Respiratory: Denies cough, dyspnea or wheezes Cardiovascular: Denies palpitation, chest discomfort Gastrointestinal:  Denies nausea, heartburn or change in bowel habits Skin: Denies abnormal skin rashes Lymphatics: Denies new lymphadenopathy or easy bruising Neurological:Denies numbness, tingling or new weaknesses Behavioral/Psych: Mood is stable, no new changes  Extremities: No lower extremity edema Breast: Bilateral mastectomies All other systems were reviewed with the patient and are negative.  I have reviewed the past medical history, past surgical history, social history and family history with the patient and they are unchanged from previous note.  ALLERGIES:  is allergic to lyrica [pregabalin].  MEDICATIONS:  Current Outpatient Prescriptions  Medication Sig Dispense Refill  . albuterol (PROVENTIL HFA;VENTOLIN HFA) 108 (90 Base) MCG/ACT inhaler Inhale 2 puffs into the lungs every 6 (six) hours as needed for wheezing or shortness of breath.    . ALPRAZolam (XANAX) 0.5 MG tablet Take 0.5 mg by mouth daily as needed for anxiety.    Marland Kitchen amLODipine (NORVASC) 5 MG tablet Take 1 tablet (5 mg total) by mouth daily. 30 tablet 0  . buPROPion (WELLBUTRIN SR) 200 MG 12 hr tablet Take 200 mg by mouth daily.    . DULoxetine (CYMBALTA) 30 MG  capsule Take 30 mg by mouth daily.    . Ginkgo Biloba (GINKOBA PO) Take 2 tablets by mouth daily.    Marland Kitchen HYDROcodone-acetaminophen (NORCO) 7.5-325 MG tablet Take 1-2 tablets by mouth every 6 (six) hours as needed (pain). (Patient not taking: Reported on 04/20/2017) 30 tablet 0  . simvastatin (ZOCOR) 40 MG tablet Take 40 mg by mouth at  bedtime.    . Tiotropium Bromide-Olodaterol (STIOLTO RESPIMAT) 2.5-2.5 MCG/ACT AERS Inhale 2 puffs into the lungs daily.    Marland Kitchen tiZANidine (ZANAFLEX) 4 MG tablet Take 4 mg by mouth every 8 (eight) hours as needed for muscle spasms.      No current facility-administered medications for this visit.     PHYSICAL EXAMINATION: ECOG PERFORMANCE STATUS: 2 - Symptomatic, <50% confined to bed  Vitals:   05/01/17 1536  BP: (!) 142/75  Pulse: 89  Resp: 18  Temp: 98.3 F (36.8 C)   Filed Weights   05/01/17 1536  Weight: 186 lb 6.4 oz (84.6 kg)    GENERAL:alert, no distress and comfortable SKIN: skin color, texture, turgor are normal, no rashes or significant lesions EYES: normal, Conjunctiva are pink and non-injected, sclera clear OROPHARYNX:no exudate, no erythema and lips, buccal mucosa, and tongue normal  NECK: supple, thyroid normal size, non-tender, without nodularity LYMPH:  no palpable lymphadenopathy in the cervical, axillary or inguinal LUNGS: clear to auscultation and percussion with normal breathing effort HEART: regular rate & rhythm and no murmurs and no lower extremity edema ABDOMEN:abdomen soft, non-tender and normal bowel sounds MUSCULOSKELETAL:no cyanosis of digits and no clubbing  NEURO: alert & oriented x 3 with fluent speech, no focal motor/sensory deficits EXTREMITIES: No lower extremity edema  LABORATORY DATA:  I have reviewed the data as listed   Chemistry      Component Value Date/Time   NA 140 03/28/2017 1133   K 4.9 03/28/2017 1133   CL 103 03/28/2017 1133   CO2 27 03/28/2017 1133   BUN 8 03/28/2017 1133   CREATININE 0.86 03/30/2017 1900      Component Value Date/Time   CALCIUM 9.4 03/28/2017 1133       Lab Results  Component Value Date   WBC 10.6 (H) 03/30/2017   HGB 14.9 03/30/2017   HCT 44.3 03/30/2017   MCV 93.7 03/30/2017   PLT 253 03/30/2017   NEUTROABS 4.8 03/05/2017    ASSESSMENT & PLAN:  Breast cancer of upper-outer quadrant of  left female breast ([facility]) Left mastectomy 03/30/2017:  IDC 3.2 cm, DCIS, margins negative, 3/18 lymph nodes positive with focal extranodal extension, grade 2, ER 20%, PR 0%, HER-2 negative ratio 1.13, Ki-67 15% stage IIB  Right mastectomy: Complex sclerosing lesion with UDH, PASH, 0/5 LN Neg   Mammaprint Result: High risk: Average 56 year old risk of recurrence untreated is a 29% without systemic chemotherapy With chemotherapy patient has a 94.6% benefit in terms of distant metastasis free survival.  Basal type  Treatment plan: Adjuvant chemotherapy with dose dense Adriamycin and Cytoxan 4 followed by Taxol weekly 12  Chemotherapy Counseling: I discussed the risks and benefits of chemotherapy including the risks of nausea/ vomiting, risk of infection from low WBC count, fatigue due to chemo or anemia, bruising or bleeding due to low platelets, mouth sores, loss/ change in taste and decreased appetite. Liver and kidney function will be monitored through out chemotherapy as abnormalities in liver and kidney function may be a side effect of treatment. Cardiac dysfunction due to Adriamycin were discussed in detail. Risk of permanent  bone marrow dysfunction and leukemia due to chemo were also discussed.  Patient understood the risks and benefits of chemotherapy and will make a decision this coming Friday. If she does not go through chemotherapy then she will be referred for adjuvant radiation.   I spent 25 minutes talking to the patient of which more than half was spent in counseling and coordination of care.  No orders of the defined types were placed in this encounter.  The patient has a good understanding of the overall plan. she agrees with it. she will call with any problems that may develop before the next visit here.   Rulon Eisenmenger, MD 05/01/17

## 2017-05-01 NOTE — Telephone Encounter (Signed)
Spoke with patient to give her an appointment to see Dr. Lindi Adie to discuss mammaprint results.  Appointment confirmed for today 05/01/17 at 230pm with Dr. Lindi Adie

## 2017-05-09 ENCOUNTER — Other Ambulatory Visit: Payer: Self-pay | Admitting: Hematology and Oncology

## 2017-05-09 ENCOUNTER — Other Ambulatory Visit: Payer: Self-pay | Admitting: General Surgery

## 2017-05-09 ENCOUNTER — Telehealth: Payer: Self-pay | Admitting: *Deleted

## 2017-05-09 DIAGNOSIS — C50412 Malignant neoplasm of upper-outer quadrant of left female breast: Secondary | ICD-10-CM

## 2017-05-09 MED ORDER — PROCHLORPERAZINE MALEATE 10 MG PO TABS
10.0000 mg | ORAL_TABLET | Freq: Four times a day (QID) | ORAL | 1 refills | Status: DC | PRN
Start: 2017-05-09 — End: 2017-06-11

## 2017-05-09 MED ORDER — DEXAMETHASONE 4 MG PO TABS: 4.0000 mg | ORAL_TABLET | Freq: Every day | ORAL | 0 refills | Status: DC

## 2017-05-09 MED ORDER — LORAZEPAM 0.5 MG PO TABS: 0.5000 mg | ORAL_TABLET | Freq: Four times a day (QID) | ORAL | 0 refills | Status: DC | PRN

## 2017-05-09 MED ORDER — LIDOCAINE-PRILOCAINE 2.5-2.5 % EX CREA: TOPICAL_CREAM | CUTANEOUS | 3 refills | Status: DC

## 2017-05-09 MED ORDER — ONDANSETRON HCL 8 MG PO TABS: 8.0000 mg | ORAL_TABLET | Freq: Two times a day (BID) | ORAL | 1 refills | Status: DC | PRN

## 2017-05-09 NOTE — Telephone Encounter (Signed)
Received call from patient stating she has decided to go ahead and receive chemo.  Informed her we get her scheduled for port placement and chemo class.

## 2017-05-09 NOTE — Progress Notes (Signed)
START ON PATHWAY REGIMEN - Breast   Doxorubicin + Cyclophosphamide (AC):   A cycle is every 21 days:     Doxorubicin      Cyclophosphamide   **Always confirm dose/schedule in your pharmacy ordering system**    Paclitaxel 80 mg/m2 Weekly:   Administer weekly:     Paclitaxel   **Always confirm dose/schedule in your pharmacy ordering system**    Patient Characteristics: Postoperative without Neoadjuvant Therapy (Pathologic Staging), Invasive Disease, Adjuvant Therapy, Node Positive (1-3), HER2 Negative/Unknown/Equivocal, ER Positive, MammaPrint(R) Ordered, High Genomic Risk Therapeutic Status: Postoperative without Neoadjuvant Therapy (Pathologic Staging) AJCC Grade: G2 AJCC N Category: pN1 AJCC M Category: cM0 ER Status: Positive (+) AJCC 8 Stage Grouping: IIB HER2 Status: Negative (-) Oncotype Dx Recurrence Score: Ordered Other Genomic Test AJCC T Category: pT2 PR Status: Negative (-) Has this patient completed genomic testing? Yes - MammaPrint(R) MammaPrint(R) Score: High Genomic Risk  Intent of Therapy: Curative Intent, Discussed with Patient

## 2017-05-11 ENCOUNTER — Telehealth: Payer: Self-pay | Admitting: Hematology and Oncology

## 2017-05-11 NOTE — Telephone Encounter (Signed)
lvm to inform pt of chemo class 6/5 at 4 pm per sch msg

## 2017-05-14 ENCOUNTER — Telehealth: Payer: Self-pay | Admitting: Hematology and Oncology

## 2017-05-14 NOTE — Telephone Encounter (Signed)
sw pt to inform her of upcoming appts 6/5, 6/11 and 6/14 per sch msg

## 2017-05-15 ENCOUNTER — Other Ambulatory Visit: Payer: Managed Care, Other (non HMO)

## 2017-05-15 ENCOUNTER — Telehealth: Payer: Self-pay | Admitting: *Deleted

## 2017-05-15 ENCOUNTER — Encounter: Payer: Self-pay | Admitting: *Deleted

## 2017-05-15 NOTE — Telephone Encounter (Signed)
No additional notes

## 2017-05-16 ENCOUNTER — Encounter (HOSPITAL_BASED_OUTPATIENT_CLINIC_OR_DEPARTMENT_OTHER): Payer: Self-pay | Admitting: *Deleted

## 2017-05-16 ENCOUNTER — Telehealth: Payer: Self-pay | Admitting: Hematology and Oncology

## 2017-05-16 NOTE — Telephone Encounter (Signed)
lvm to inform pt of r/s chemo class 6/8 at 0930 per sch msg

## 2017-05-18 ENCOUNTER — Other Ambulatory Visit: Payer: Managed Care, Other (non HMO)

## 2017-05-18 ENCOUNTER — Telehealth: Payer: Self-pay | Admitting: *Deleted

## 2017-05-21 ENCOUNTER — Telehealth: Payer: Self-pay | Admitting: *Deleted

## 2017-05-21 ENCOUNTER — Ambulatory Visit: Payer: Managed Care, Other (non HMO) | Admitting: Hematology and Oncology

## 2017-05-21 ENCOUNTER — Other Ambulatory Visit: Payer: Managed Care, Other (non HMO)

## 2017-05-21 NOTE — Assessment & Plan Note (Deleted)
Left mastectomy 03/30/2017: IDC 3.2 cm, DCIS, margins negative, 3/18 lymph nodes positive with focal extranodal extension, grade 2, ER 20%, PR 0%, HER-2 negative ratio 1.13, Ki-67 15% stage IIB  Right mastectomy: Complex sclerosing lesion with UDH, PASH, 0/5 LN Neg   Mammaprint Result: High risk: Average 56-year-old risk of recurrence untreated is a 29% without systemic chemotherapy With chemotherapy patient has a 94.6% benefit in terms of distant metastasis free survival.  Basal type  Treatment plan recommended: Adjuvant chemotherapy with dose dense Adriamycin and Cytoxan 4 followed by Taxol weekly 12  Patient may be referred to [facility] for second opinion. 

## 2017-05-21 NOTE — Telephone Encounter (Signed)
Left message for a return phone call concerning her appointments.  She has not shown up for her appointment with Dr. Lindi Adie today.  There is an appointment note that states she is getting a second opinion.

## 2017-05-21 NOTE — H&P (Signed)
Margaret James Location: [facility] Patient #: [id number] DOB: [birth year] Married / Language: English / Race: White Female       History of Present Illness      This is a 56 year old female who returns  for her first postop visit following bilateral mastectomy. She had declined preoperative radiation therapy and medical oncology consultation consult but now agrees to go she was discussed in breast conference for consensus opinion.      On March 30, 2017 she underwent left modified radical mastectomy and right total mastectomy. Final pathology report shows no cancer in the right breast or right axillary nodes. Final pathology report shows a 3.2 cm cancer in the left breast with negative margins. 3 out of 18 lymph nodes were positive for metastatic cancer. ER is weak at 20%, PR and HER-2 are negative. Almost a triple negative breast cancer. She is draining too much from the drains to remove the drains. We checked the wounds today and they are okay without necrosis or fluid collection. We showed her how to do shoulder range of motion exercises. I showed her how to keep a better chart of the drainage with her daughter.       Comorbidities include chronic back pain on opiods, HTN, clinically significant chronic anxiety and depression, COPD, tobacco abuse, hyperlipidemia,       Long history of extensive multiple opinions prior to surgery.       I told her that I thought that her long-term survival would be better with chemotherapy and radiation therapy and we need an opinion.  She agrees to be referred to radiation oncology and medical oncology. She previously canceled these appointments.     She saw Dr. Lindi Adie and now agrees to chemotherapy and port insertion. We have discussed there indications, techniques and risks of this surgery in detail. She understands all of these issues, all questions answered, she agrees with this plan.    Allergies  Allergies Reconciled    Medication History  Medications Reconciled  Vitals  Weight: 185 lb Height: 67in Body Surface Area: 1.96 m Body Mass Index: 28.97 kg/m  Pulse: 86 (Regular)  BP: 120/78 (Sitting, Left Arm, Standard)       Physical Exam General Note: Alert. Minimal distress. Ambulatory. Stronger tobacco. BMI 28  General:  Alert and cooperative. minumal distress. Breast Note: Bilateral mastectomy wounds look good. Some ecchymoses but no necrosis. No infection. No fluid collections no hematoma. No arm swelling. Some sensory deficit back of left arm, not unexpected. Wounds and drain sites were redressed.  HEENT: no adenopathy and no mass. Supple.  Lungs:clear bilaterally  CV: RRR, no murmer or ectopy  Abd:  No mass or scar or hernia or organomegaly.     Assessment & Plan  PRIMARY CANCER OF UPPER OUTER QUADRANT OF LEFT FEMALE BREAST (C50.412) You will be scheduled for port a cath insertion.  Your mastectomy wounds look fine. There is a little bruising but the skin is healthy otherwise You are draining too much to remove the drains I showed you how to keep a chart of the output from the drains  Try to quit smoking  CHRONIC BACK PAIN GREATER THAN 3 MONTHS DURATION (M54.9) CONTINUOUS TOBACCO ABUSE (Z72.0) HYPERTENSION, ESSENTIAL (I10) ANXIETY AND DEPRESSION (F41.9) BMI 31.0-31.9,ADULT (Z68.31) COPD, MODERATE (J44.9) HISTORY OF VAGINAL HYSTERECTOMY (Z90.710)    Raychel Dowler M. Dalbert Batman, M.D., [facility] General and Minimally invasive Surgery Breast and Colorectal Surgery Office:   [phone] Pager:  [phone]

## 2017-05-22 ENCOUNTER — Telehealth: Payer: Self-pay | Admitting: *Deleted

## 2017-05-22 NOTE — Telephone Encounter (Signed)
Left another message for patient to return my call regarding her schedule and chemo.  I spoke with Dr. Darrel Hoover nurse at [facility] regarding patient.  She is scheduled for port placement tomorrow, but they have not heard from her either.

## 2017-05-23 ENCOUNTER — Telehealth: Payer: Self-pay | Admitting: *Deleted

## 2017-05-23 ENCOUNTER — Telehealth: Payer: Self-pay

## 2017-05-23 ENCOUNTER — Ambulatory Visit (HOSPITAL_BASED_OUTPATIENT_CLINIC_OR_DEPARTMENT_OTHER)
Admission: RE | Admit: 2017-05-23 | Payer: Managed Care, Other (non HMO) | Source: Ambulatory Visit | Admitting: General Surgery

## 2017-05-23 SURGERY — INSERTION, TUNNELED CENTRAL VENOUS DEVICE, WITH PORT
Anesthesia: General

## 2017-05-23 NOTE — Telephone Encounter (Signed)
Received a call from Dr.Hampton's office (platic surgery) and spoke with Roselyn Reef. They are requesting to have latest notes from [place]. Pt has scheduled appt with Dr.Hampton this afternoon. This will be a evaluation for plastics per Dr.Ingram's referral. Faxed requested info to [phone]. Their office number is [phone]

## 2017-05-23 NOTE — Telephone Encounter (Signed)
"  Jaime with Dr. Moishe Spice of [facility] [phone]: [phone]).  We received referral from Dr. Dalbert Batman.  Will see her this afternoon.  Calling to obtain any office notes from medical and radiation oncology records.  Please fax before we see her this afternoon."

## 2017-05-24 ENCOUNTER — Ambulatory Visit: Payer: Managed Care, Other (non HMO)

## 2017-05-24 ENCOUNTER — Telehealth: Payer: Self-pay | Admitting: *Deleted

## 2017-05-24 NOTE — Telephone Encounter (Signed)
Received call from patient stating she is upset because she went to see the plastic surgeon Dr. Moishe Spice and he told her that he won't do the surgery without her having chemo and radiation.  She states "This is my body and I can do what I want to.  I do not want chemo or radiation and I'm not going to live that long anyway."  After much discussion she said that maybe Dr. Moishe Spice just needs the ok to do her surgery without chemo and radiation.   I got her to agree to keep her appointment with Dr. Lindi Adie for 6/28.  I have let Dr. Dalbert Batman and his nurse know what is going on.

## 2017-05-24 NOTE — Telephone Encounter (Signed)
Left message for a return phone call regarding her appointments here.  I spoke with Dr. Darrel Hoover nurse today and she states that patient is not interested in receiving chemo and is going to see a plastic surgeon that have referred her to.

## 2017-05-31 ENCOUNTER — Other Ambulatory Visit: Payer: Managed Care, Other (non HMO)

## 2017-05-31 ENCOUNTER — Ambulatory Visit: Payer: Managed Care, Other (non HMO) | Admitting: Adult Health

## 2017-06-05 ENCOUNTER — Telehealth: Payer: Self-pay | Admitting: *Deleted

## 2017-06-05 NOTE — Telephone Encounter (Signed)
Spoke with patient to remind her the appointment with Dr. Lindi Adie on 06/07/17 at1030am to discuss her plan.  She wanted Dr. Lindi Adie to call the plastic surgeon and tell him to the plastic surgery without her receiving chemo.   Informed her that Dr. Lindi Adie could not make another physician do that but she needed to come in for her appointments to discuss the next steps.  Also reminded her of the appointment with Dr. Dalbert Batman 7/3 at 430.  Patient verbalized understanding.

## 2017-06-07 ENCOUNTER — Ambulatory Visit: Payer: Managed Care, Other (non HMO)

## 2017-06-07 ENCOUNTER — Ambulatory Visit: Payer: Managed Care, Other (non HMO) | Admitting: Hematology and Oncology

## 2017-06-07 ENCOUNTER — Other Ambulatory Visit: Payer: Managed Care, Other (non HMO)

## 2017-06-11 ENCOUNTER — Encounter: Payer: Self-pay | Admitting: Hematology and Oncology

## 2017-06-11 ENCOUNTER — Ambulatory Visit (HOSPITAL_BASED_OUTPATIENT_CLINIC_OR_DEPARTMENT_OTHER): Payer: Managed Care, Other (non HMO) | Admitting: Hematology and Oncology

## 2017-06-11 ENCOUNTER — Telehealth: Payer: Self-pay | Admitting: *Deleted

## 2017-06-11 DIAGNOSIS — Z79811 Long term (current) use of aromatase inhibitors: Secondary | ICD-10-CM

## 2017-06-11 DIAGNOSIS — C773 Secondary and unspecified malignant neoplasm of axilla and upper limb lymph nodes: Secondary | ICD-10-CM | POA: Diagnosis not present

## 2017-06-11 DIAGNOSIS — C50412 Malignant neoplasm of upper-outer quadrant of left female breast: Secondary | ICD-10-CM | POA: Diagnosis not present

## 2017-06-11 DIAGNOSIS — Z170 Estrogen receptor positive status [ER+]: Secondary | ICD-10-CM | POA: Diagnosis not present

## 2017-06-11 MED ORDER — ANASTROZOLE 1 MG PO TABS: 1.0000 mg | ORAL_TABLET | Freq: Every day | ORAL | 3 refills | Status: DC

## 2017-06-11 NOTE — Telephone Encounter (Signed)
FYI "I'm on my way. Running late due to an accident on [address]."

## 2017-06-11 NOTE — Assessment & Plan Note (Signed)
Left mastectomy 03/30/2017: IDC 3.2 cm, DCIS, margins negative, 3/18 lymph nodes positive with focal extranodal extension, grade 2, ER 20%, PR 0%, HER-2 negative ratio 1.13, Ki-67 15% stage IIB  Right mastectomy: Complex sclerosing lesion with UDH, PASH, 0/5 LN Neg   Mammaprint Result: High risk: Average 56 year old risk of recurrence untreated is a 29% without systemic chemotherapy With chemotherapy patient has a 94.6% benefit in terms of distant metastasis free survival.  Basal type  Treatment plan recommended: Adjuvant chemotherapy with dose dense Adriamycin and Cytoxan 4 followed by Taxol weekly 12  Patient may be referred to [facility] for second opinion.

## 2017-06-11 NOTE — Progress Notes (Signed)
Patient Care Team: Celene Squibb, MD as PCP - General (Internal Medicine)  DIAGNOSIS:  Encounter Diagnosis  Name Primary?  . Malignant neoplasm of upper-outer quadrant of left breast in female, estrogen receptor positive ([place])     SUMMARY OF ONCOLOGIC HISTORY:   Breast cancer of upper-outer quadrant of left female breast ([place])   01/02/2017 Mammogram    Diagnostic mammogram and US  IMPRESSION: 1. Findings highly suspicious for malignancy in the upper outer quadrant of the left breast, measuring 1.8 x 2.1 x 1.6 cm. Medially adjacent to this mass is a 6 mm oval nearly anechoic nodule which could be a cyst or tumor nodule   2. A suspicious left axillary lymph node. 3. Probable fibroadenoma upper-outer quadrant right breast at 10 o'clock. 4. Probably benign calcifications in the right breast.      01/30/2017 Initial Biopsy    Breast, left, needle core biopsy, upper outer 2:00 9 cm fn - INVASIVE DUCTAL CARCINOMA, SEE COMMENT. - DUCTAL CARCINOMA IN SITU. Microscopic Comment The carcinoma appears grade 2.      01/30/2017 Receptors her2    ER 20%+, weak staining, PR-, HER2-, Ki67 15%      03/30/2017 Surgery    Left mastectomy:  IDC 3.2 cm, DCIS, margins negative, 3/18 lymph nodes positive with focal extranodal extension, grade 2, ER 20%, PR 0%, HER-2 negative ratio 1.13, Ki-67 15% stage IIB Right mastectomy: Complex sclerosing lesion with UDH, PASH, 0/5 LN Neg       04/24/2017 Pathology Results    Mammaprint testing: High risk, 10 year risk of recurrence untreated 29%; 5 year distant metastases free interval 94.6% with came on hormonal therapy, basal type       Chemotherapy    Refused chemo      06/11/2017 -  Anti-estrogen oral therapy    Anastrozole 1 mg daily       CHIEF COMPLIANT: Follow-up to discuss the treatment plan  INTERVAL HISTORY: Margaret James is a 56 year old lady with above-mentioned history left mastectomy was found to be high risk for Mammaprint testing  but she did not want to undergo adjuvant chemotherapy. She was seen by plastic surgery who refused to operate on 100 she did not receive recommended breast cancer treatment. She is here to complete her face to allow her to undergo plastic surgery for reconstruction. She tells me that she is actively quitting smoking.  REVIEW OF SYSTEMS:   Constitutional: Denies fevers, chills or abnormal weight loss Eyes: Denies blurriness of vision Ears, nose, mouth, throat, and face: Denies mucositis or sore throat Respiratory: Denies cough, dyspnea or wheezes Cardiovascular: Denies palpitation, chest discomfort Gastrointestinal:  Denies nausea, heartburn or change in bowel habits Skin: Denies abnormal skin rashes Lymphatics: Denies new lymphadenopathy or easy bruising Neurological:Denies numbness, tingling or new weaknesses Behavioral/Psych: Mood is stable, no new changes  Extremities: No lower extremity edema  All other systems were reviewed with the patient and are negative.  I have reviewed the past medical history, past surgical history, social history and family history with the patient and they are unchanged from previous note.  ALLERGIES:  is allergic to lyrica [pregabalin].  MEDICATIONS:  Current Outpatient Prescriptions  Medication Sig Dispense Refill  . albuterol (PROVENTIL HFA;VENTOLIN HFA) 108 (90 Base) MCG/ACT inhaler Inhale 2 puffs into the lungs every 6 (six) hours as needed for wheezing or shortness of breath.    . ALPRAZolam (XANAX) 0.5 MG tablet Take 0.5 mg by mouth daily as needed for anxiety.    Marland Kitchen  amLODipine (NORVASC) 5 MG tablet Take 1 tablet (5 mg total) by mouth daily. 30 tablet 0  . anastrozole (ARIMIDEX) 1 MG tablet Take 1 tablet (1 mg total) by mouth daily. 90 tablet 3  . buPROPion (WELLBUTRIN SR) 200 MG 12 hr tablet Take 200 mg by mouth daily.    . DULoxetine (CYMBALTA) 30 MG capsule Take 30 mg by mouth daily.    . Ginkgo Biloba (GINKOBA PO) Take 2 tablets by mouth daily.     . simvastatin (ZOCOR) 40 MG tablet Take 40 mg by mouth at bedtime.    . Tiotropium Bromide-Olodaterol (STIOLTO RESPIMAT) 2.5-2.5 MCG/ACT AERS Inhale 2 puffs into the lungs daily.    Marland Kitchen tiZANidine (ZANAFLEX) 4 MG tablet Take 4 mg by mouth every 8 (eight) hours as needed for muscle spasms.      No current facility-administered medications for this visit.     PHYSICAL EXAMINATION: ECOG PERFORMANCE STATUS: 1 - Symptomatic but completely ambulatory  Vitals:   06/11/17 1209  BP: (!) 128/96  Pulse: 93  Resp: (!) 21  Temp: 98.2 F (36.8 C)   Filed Weights   06/11/17 1209  Weight: 188 lb 8 oz (85.5 kg)    GENERAL:alert, no distress and comfortable SKIN: skin color, texture, turgor are normal, no rashes or significant lesions EYES: normal, Conjunctiva are pink and non-injected, sclera clear OROPHARYNX:no exudate, no erythema and lips, buccal mucosa, and tongue normal  NECK: supple, thyroid normal size, non-tender, without nodularity LYMPH:  no palpable lymphadenopathy in the cervical, axillary or inguinal LUNGS: clear to auscultation and percussion with normal breathing effort HEART: regular rate & rhythm and no murmurs and no lower extremity edema ABDOMEN:abdomen soft, non-tender and normal bowel sounds MUSCULOSKELETAL:no cyanosis of digits and no clubbing  NEURO: alert & oriented x 3 with fluent speech, no focal motor/sensory deficits EXTREMITIES: No lower extremity edema BREAST: No palpable masses or nodules in either right or left breasts. No palpable axillary supraclavicular or infraclavicular adenopathy no breast tenderness or nipple discharge. (exam performed in the presence of a chaperone)  LABORATORY DATA:  I have reviewed the data as listed   Chemistry      Component Value Date/Time   NA 140 03/28/2017 1133   K 4.9 03/28/2017 1133   CL 103 03/28/2017 1133   CO2 27 03/28/2017 1133   BUN 8 03/28/2017 1133   CREATININE 0.86 03/30/2017 1900      Component Value Date/Time    CALCIUM 9.4 03/28/2017 1133       Lab Results  Component Value Date   WBC 10.6 (H) 03/30/2017   HGB 14.9 03/30/2017   HCT 44.3 03/30/2017   MCV 93.7 03/30/2017   PLT 253 03/30/2017   NEUTROABS 4.8 03/05/2017    ASSESSMENT & PLAN:  Breast cancer of upper-outer quadrant of left female breast ([place]) Left mastectomy 03/30/2017: IDC 3.2 cm, DCIS, margins negative, 3/18 lymph nodes positive with focal extranodal extension, grade 2, ER 20%, PR 0%, HER-2 negative ratio 1.13, Ki-67 15% stage IIB  Right mastectomy: Complex sclerosing lesion with UDH, PASH, 0/5 LN Neg   Mammaprint Result: High risk: Average 57 year old risk of recurrence untreated is a 29% without systemic chemotherapy With chemotherapy patient has a 94.6% benefit in terms of distant metastasis free survival.  Basal type  Adjuvant chemotherapy with dose dense Adriamycin and Cytoxan 4 followed by Taxol weekly 12; patient did not want to undergo chemotherapy because she did not want to feel sick or lose her hair.  Treatment plan: I recommend the starting antiestrogen therapy with anastrozole 1 mg daily. Anastrozole counseling:We discussed the risks and benefits of anti-estrogen therapy with aromatase inhibitors. These include but not limited to insomnia, hot flashes, mood changes, vaginal dryness, bone density loss, and weight gain. We strongly believe that the benefits far outweigh the risks. Patient understands these risks and consented to starting treatment. Planned treatment duration is 5 years.  Return to clinic in 3 months for toxicity check and evaluation. I spent 25 minutes talking to the patient of which more than half was spent in counseling and coordination of care.  No orders of the defined types were placed in this encounter.  The patient has a good understanding of the overall plan. she agrees with it. she will call with any problems that may develop before the next visit here.   Rulon Eisenmenger,  MD 06/11/17

## 2017-06-21 ENCOUNTER — Other Ambulatory Visit: Payer: Managed Care, Other (non HMO)

## 2017-06-21 ENCOUNTER — Ambulatory Visit: Payer: Managed Care, Other (non HMO)

## 2017-06-21 ENCOUNTER — Ambulatory Visit: Payer: Managed Care, Other (non HMO) | Admitting: Hematology and Oncology

## 2017-07-03 ENCOUNTER — Encounter (HOSPITAL_COMMUNITY): Payer: Self-pay

## 2017-07-03 ENCOUNTER — Encounter (HOSPITAL_COMMUNITY): Payer: 59

## 2017-07-05 ENCOUNTER — Ambulatory Visit: Payer: Managed Care, Other (non HMO) | Admitting: Hematology and Oncology

## 2017-07-05 ENCOUNTER — Ambulatory Visit: Payer: Managed Care, Other (non HMO)

## 2017-07-05 ENCOUNTER — Other Ambulatory Visit: Payer: Managed Care, Other (non HMO)

## 2017-09-11 ENCOUNTER — Telehealth: Payer: Self-pay | Admitting: Hematology and Oncology

## 2017-09-11 ENCOUNTER — Ambulatory Visit (HOSPITAL_BASED_OUTPATIENT_CLINIC_OR_DEPARTMENT_OTHER): Payer: Managed Care, Other (non HMO) | Admitting: Hematology and Oncology

## 2017-09-11 ENCOUNTER — Other Ambulatory Visit (HOSPITAL_BASED_OUTPATIENT_CLINIC_OR_DEPARTMENT_OTHER): Payer: Managed Care, Other (non HMO)

## 2017-09-11 DIAGNOSIS — Z79811 Long term (current) use of aromatase inhibitors: Secondary | ICD-10-CM | POA: Diagnosis not present

## 2017-09-11 DIAGNOSIS — C773 Secondary and unspecified malignant neoplasm of axilla and upper limb lymph nodes: Secondary | ICD-10-CM

## 2017-09-11 DIAGNOSIS — Z170 Estrogen receptor positive status [ER+]: Secondary | ICD-10-CM | POA: Diagnosis not present

## 2017-09-11 DIAGNOSIS — R1032 Left lower quadrant pain: Secondary | ICD-10-CM

## 2017-09-11 DIAGNOSIS — C50412 Malignant neoplasm of upper-outer quadrant of left female breast: Secondary | ICD-10-CM

## 2017-09-11 DIAGNOSIS — R0789 Other chest pain: Secondary | ICD-10-CM

## 2017-09-11 LAB — CBC WITH DIFFERENTIAL/PLATELET
BASO%: 1 % (ref 0.0–2.0)
Basophils Absolute: 0.1 10*3/uL (ref 0.0–0.1)
EOS%: 1.4 % (ref 0.0–7.0)
Eosinophils Absolute: 0.1 10*3/uL (ref 0.0–0.5)
HCT: 46.8 % — ABNORMAL HIGH (ref 34.8–46.6)
HGB: 15.7 g/dL (ref 11.6–15.9)
LYMPH%: 37.5 % (ref 14.0–49.7)
MCH: 31 pg (ref 25.1–34.0)
MCHC: 33.6 g/dL (ref 31.5–36.0)
MCV: 92.2 fL (ref 79.5–101.0)
MONO#: 0.8 10*3/uL (ref 0.1–0.9)
MONO%: 9.3 % (ref 0.0–14.0)
NEUT#: 4.1 10*3/uL (ref 1.5–6.5)
NEUT%: 50.8 % (ref 38.4–76.8)
Platelets: 334 10*3/uL (ref 145–400)
RBC: 5.08 10*6/uL (ref 3.70–5.45)
RDW: 13.7 % (ref 11.2–14.5)
WBC: 8.1 10*3/uL (ref 3.9–10.3)
lymph#: 3 10*3/uL (ref 0.9–3.3)

## 2017-09-11 LAB — COMPREHENSIVE METABOLIC PANEL
ALT: 18 U/L (ref 0–55)
AST: 15 U/L (ref 5–34)
Albumin: 4.5 g/dL (ref 3.5–5.0)
Alkaline Phosphatase: 66 U/L (ref 40–150)
Anion Gap: 13 mEq/L — ABNORMAL HIGH (ref 3–11)
BUN: 15.5 mg/dL (ref 7.0–26.0)
CO2: 26 mEq/L (ref 22–29)
Calcium: 10 mg/dL (ref 8.4–10.4)
Chloride: 100 mEq/L (ref 98–109)
Creatinine: 1 mg/dL (ref 0.6–1.1)
EGFR: 63 mL/min/{1.73_m2} — ABNORMAL LOW (ref >90)
Glucose: 100 mg/dl (ref 70–140)
Potassium: 4.3 mEq/L (ref 3.5–5.1)
Sodium: 139 mEq/L (ref 136–145)
Total Bilirubin: 0.38 mg/dL (ref 0.20–1.20)
Total Protein: 8.2 g/dL (ref 6.4–8.3)

## 2017-09-11 NOTE — Telephone Encounter (Signed)
Gave patient avs and calendar with appts per 10/2 los.

## 2017-09-11 NOTE — Assessment & Plan Note (Signed)
Left mastectomy 03/30/2017: IDC 3.2 cm, DCIS, margins negative, 3/18 lymph nodes positive with focal extranodal extension, grade 2, ER 20%, PR 0%, HER-2 negative ratio 1.13, Ki-67 15% stage IIB  Right mastectomy: Complex sclerosing lesion with UDH, PASH, 0/5 LN Neg   Mammaprint Result: High risk: Average 56 year old risk of recurrence untreated is a 29% without systemic chemotherapy Basal type  Adjuvant chemotherapy was recommended but patient did not want to undergo chemotherapy because she did not want to feel sick or lose her hair.  Treatment plan: Anastrozole 1 mg daily started 06/11/2017  Anastrozole toxicities:

## 2017-09-11 NOTE — Progress Notes (Signed)
Patient Care Team: Margaret Squibb, MD as PCP - General (Internal Medicine)  DIAGNOSIS:  Encounter Diagnosis  Name Primary?  . Malignant neoplasm of upper-outer quadrant of left breast in female, estrogen receptor positive (Margaret James)     SUMMARY OF ONCOLOGIC HISTORY:   Breast cancer of upper-outer quadrant of left female breast (Margaret James)   01/02/2017 Mammogram    Diagnostic mammogram and US  IMPRESSION: 1. Findings highly suspicious for malignancy in the upper outer quadrant of the left breast, measuring 1.8 x 2.1 x 1.6 cm. Medially adjacent to this mass is a 6 mm oval nearly anechoic nodule which could be a cyst or tumor nodule   2. A suspicious left axillary lymph node. 3. Probable fibroadenoma upper-outer quadrant right breast at 10 o'clock. 4. Probably benign calcifications in the right breast.      01/30/2017 Initial Biopsy    Breast, left, needle core biopsy, upper outer 2:00 9 cm fn - INVASIVE DUCTAL CARCINOMA, SEE COMMENT. - DUCTAL CARCINOMA IN SITU. Microscopic Comment The carcinoma appears grade 2.      01/30/2017 Receptors her2    ER 20%+, weak staining, PR-, HER2-, Ki67 15%      03/30/2017 Surgery    Left mastectomy:  IDC 3.2 cm, DCIS, margins negative, 3/18 lymph nodes positive with focal extranodal extension, grade 2, ER 20%, PR 0%, HER-2 negative ratio 1.13, Ki-67 15% stage IIB Right mastectomy: Complex sclerosing lesion with UDH, PASH, 0/5 LN Neg       04/24/2017 Pathology Results    Mammaprint testing: High risk, 10 year risk of recurrence untreated 29%; 5 year distant metastases free interval 94.6% with came on hormonal therapy, basal type       Chemotherapy    Refused chemo      06/11/2017 -  Anti-estrogen oral therapy    Anastrozole 1 mg daily       CHIEF COMPLIANT: Follow-up on anastrozole therapy, complaining of severe hot flashes  INTERVAL HISTORY: Margaret James is a 56 year old with above-mentioned history of left breast cancer who underwent left  mastectomy. She refused chemotherapy and is currently on anastrozole therapy. She has had a lot of hot flashes. She keeps bothering her but she is able to tolerate it well. Her biggest issue is that she wants to undergo plastic surgery for breast reconstruction but the plastic surgeon that she had met did not want to proceed with her plastic surgery for unknown reasons. She would like me to send another referral. She is currently smoking but is preparing to quit cigarettes in anticipation that she might need to do that for the plastic surgery to be successful.  REVIEW OF SYSTEMS:   Constitutional: Denies fevers, chills or abnormal weight loss Eyes: Denies blurriness of vision Ears, nose, mouth, throat, and face: Denies mucositis or sore throat Respiratory: Mild wheezing Cardiovascular: Denies palpitation, chest discomfort Gastrointestinal:  Denies nausea, heartburn or change in bowel habits Skin: Denies abnormal skin rashes Lymphatics: Denies new lymphadenopathy or easy bruising Neurological:Denies numbness, tingling or new weaknesses Behavioral/Psych: Mood is stable, no new changes  Extremities: No lower extremity edema  All other systems were reviewed with the patient and are negative.  I have reviewed the past medical history, past surgical history, social history and family history with the patient and they are unchanged from previous note.  ALLERGIES:  is allergic to lyrica [pregabalin].  MEDICATIONS:  Current Outpatient Prescriptions  Medication Sig Dispense Refill  . albuterol (PROVENTIL HFA;VENTOLIN HFA) 108 (90 Base) MCG/ACT inhaler  Inhale 2 puffs into the lungs every 6 (six) hours as needed for wheezing or shortness of breath.    . ALPRAZolam (XANAX) 0.5 MG tablet Take 0.5 mg by mouth daily as needed for anxiety.    Marland Kitchen amLODipine (NORVASC) 5 MG tablet Take 1 tablet (5 mg total) by mouth daily. 30 tablet 0  . anastrozole (ARIMIDEX) 1 MG tablet Take 1 tablet (1 mg total) by mouth  daily. 90 tablet 3  . buPROPion (WELLBUTRIN SR) 200 MG 12 hr tablet Take 200 mg by mouth daily.    . DULoxetine (CYMBALTA) 30 MG capsule Take 30 mg by mouth daily.    . Ginkgo Biloba (GINKOBA PO) Take 2 tablets by mouth daily.    . simvastatin (ZOCOR) 40 MG tablet Take 40 mg by mouth at bedtime.    . Tiotropium Bromide-Olodaterol (STIOLTO RESPIMAT) 2.5-2.5 MCG/ACT AERS Inhale 2 puffs into the lungs daily.    Marland Kitchen tiZANidine (ZANAFLEX) 4 MG tablet Take 4 mg by mouth every 8 (eight) hours as needed for muscle spasms.      No current facility-administered medications for this visit.     PHYSICAL EXAMINATION: ECOG PERFORMANCE STATUS: 1 - Symptomatic but completely ambulatory  Vitals:   09/11/17 1358  BP: 135/85  Pulse: (!) 103  Resp: 16  Temp: 97.8 F (36.6 C)  SpO2: 95%   Filed Weights   09/11/17 1358  Weight: 185 lb 4.8 oz (84.1 kg)    GENERAL:alert, no distress and comfortable SKIN: skin color, texture, turgor are normal, no rashes or significant lesions EYES: normal, Conjunctiva are pink and non-injected, sclera clear OROPHARYNX:no exudate, no erythema and lips, buccal mucosa, and tongue normal  NECK: supple, thyroid normal size, non-tender, without nodularity LYMPH:  no palpable lymphadenopathy in the cervical, axillary or inguinal LUNGS: Bilateral lung wheezing HEART: regular rate & rhythm and no murmurs and no lower extremity edema ABDOMEN:abdomen soft, non-tender and normal bowel sounds MUSCULOSKELETAL:no cyanosis of digits and no clubbing  NEURO: alert & oriented x 3 with fluent speech, no focal motor/sensory deficits EXTREMITIES: No lower extremity edema  LABORATORY DATA:  I have reviewed the data as listed   Chemistry      Component Value Date/Time   NA 140 03/28/2017 1133   K 4.9 03/28/2017 1133   CL 103 03/28/2017 1133   CO2 27 03/28/2017 1133   BUN 8 03/28/2017 1133   CREATININE 0.86 03/30/2017 1900      Component Value Date/Time   CALCIUM 9.4 03/28/2017  1133       Lab Results  Component Value Date   WBC 8.1 09/11/2017   HGB 15.7 09/11/2017   HCT 46.8 (H) 09/11/2017   MCV 92.2 09/11/2017   PLT 334 09/11/2017   NEUTROABS 4.1 09/11/2017    ASSESSMENT & PLAN:  Breast cancer of upper-outer quadrant of left female breast ([place]) Left mastectomy 03/30/2017: IDC 3.2 cm, DCIS, margins negative, 3/18 lymph nodes positive with focal extranodal extension, grade 2, ER 20%, PR 0%, HER-2 negative ratio 1.13, Ki-67 15% stage IIB  Right mastectomy: Complex sclerosing lesion with UDH, PASH, 0/5 LN Neg   Mammaprint Result: High risk: Average 57 year old risk of recurrence untreated is a 29% without systemic chemotherapy Basal type  Adjuvant chemotherapy was recommended but patient did not want to undergo chemotherapy because she did not want to feel sick or lose her hair.  Treatment plan: Anastrozole 1 mg daily started 06/11/2017  Anastrozole toxicities: 1. Hot flashes 4-5 times work the day and  even at night. Patient would likely refer her to plastic surgery. I sent a message to Dr. Leland Johns and will send a referral to her.  I spent 25 minutes talking to the patient of which more than half was spent in counseling and coordination of care.  No orders of the defined types were placed in this encounter.  The patient has a good understanding of the overall plan. she agrees with it. she will call with any problems that may develop before the next visit here.   Rulon Eisenmenger, MD 09/11/17

## 2017-09-11 NOTE — Telephone Encounter (Signed)
Scheduled patients consult with Dr. Iran Planas and spoke with patient regarding the details.

## 2017-09-12 ENCOUNTER — Telehealth: Payer: Self-pay | Admitting: Hematology and Oncology

## 2017-09-12 NOTE — Telephone Encounter (Signed)
FAXED RECORDS TO DR [facility] [phone]

## 2017-09-14 ENCOUNTER — Encounter: Payer: Self-pay | Admitting: Obstetrics and Gynecology

## 2017-09-18 ENCOUNTER — Encounter (HOSPITAL_COMMUNITY): Payer: Self-pay

## 2017-09-18 ENCOUNTER — Telehealth: Payer: Self-pay | Admitting: *Deleted

## 2017-09-18 ENCOUNTER — Ambulatory Visit (HOSPITAL_COMMUNITY)
Admission: RE | Admit: 2017-09-18 | Discharge: 2017-09-18 | Disposition: A | Discharge status: Home / Self Care | Payer: Managed Care, Other (non HMO) | Source: Ambulatory Visit | Attending: Hematology and Oncology | Admitting: Hematology and Oncology

## 2017-09-18 DIAGNOSIS — I700 Atherosclerosis of aorta: Secondary | ICD-10-CM | POA: Diagnosis not present

## 2017-09-18 DIAGNOSIS — R0789 Other chest pain: Secondary | ICD-10-CM | POA: Diagnosis present

## 2017-09-18 DIAGNOSIS — R1032 Left lower quadrant pain: Secondary | ICD-10-CM | POA: Diagnosis present

## 2017-09-18 DIAGNOSIS — J439 Emphysema, unspecified: Secondary | ICD-10-CM | POA: Insufficient documentation

## 2017-09-18 DIAGNOSIS — K429 Umbilical hernia without obstruction or gangrene: Secondary | ICD-10-CM | POA: Insufficient documentation

## 2017-09-18 MED ORDER — IOPAMIDOL (ISOVUE-300) INJECTION 61%
100.0000 mL | Freq: Once | INTRAVENOUS | Status: AC | PRN
  Administered 2017-09-18: 100 mL via INTRAVENOUS

## 2017-09-18 NOTE — Telephone Encounter (Signed)
Called and spoke with pt regarding pt ct cap result. Per Dr.Gudena review, pt CT are all normal and no recurrence or malignancy. Pt is very happy to hear about her results. Pt still puzzled as to why she is still experiencing bone pain from her hips and ribs. Pt on anastrozole x 2 months and started experiencing bone pain after taking medication. Pt tried OTC pain medication, massage, and physical exercise, but still to no improvement. Told pt to stop taking the anastrozole for 2 weeks and see if her symptoms improve. If it does, per Dr.Gudena, pt may start on letrozole instead. Pt verbalized understanding and will monitor symptoms in the next 2 weeks. She will call back to report her updates on her symptoms. No further questions or concerns with this phone encounter. MD was made aware of pt complaints.

## 2017-09-18 NOTE — Telephone Encounter (Signed)
"  Calling today same time as yesterday for my lab and Ct scan results as instructed to by Dr. Lindi Adie.  I need a call as soon as possible.  Call me at [phone]."     Noted recent Lab performed 09-11-2017.  CT chest/ab/pel performed today at [facility] at 12:00 pm. Routing call information to collaborative nurse and provider for review.  Further patient communication through collaborative nurse.

## 2017-09-20 ENCOUNTER — Encounter: Payer: Self-pay | Admitting: Obstetrics & Gynecology

## 2018-01-22 ENCOUNTER — Other Ambulatory Visit: Payer: Self-pay | Admitting: Hematology and Oncology

## 2018-03-05 ENCOUNTER — Telehealth: Payer: Self-pay

## 2018-03-07 ENCOUNTER — Telehealth: Payer: Self-pay | Admitting: Hematology and Oncology

## 2018-03-07 NOTE — Telephone Encounter (Signed)
Pt called to notify Dr.Gudena that her insurance have changed and that Dr.Gudena is not in network with her insurance. Pt asking for referral to a different oncologist in the [place] area. Pt voiced frustration since she really likes seeing Dr.Gudena. Will forward concern to Darlena to see if this is accurate information, regarding her insurance coverage. Pt states that all her other drs in [place] are all in network. Will update pt as soon as information becomes available.

## 2018-03-07 NOTE — Telephone Encounter (Signed)
Patient called to reschedule for 4/2 she said she has insurance information

## 2018-03-12 ENCOUNTER — Ambulatory Visit: Payer: Managed Care, Other (non HMO) | Admitting: Hematology and Oncology

## 2018-03-12 NOTE — Assessment & Plan Note (Deleted)
Left mastectomy 03/30/2017: IDC 3.2 cm, DCIS, margins negative, 3/18 lymph nodes positive with focal extranodal extension, grade 2, ER 20%, PR 0%, HER-2 negative ratio 1.13, Ki-67 15% stage IIB  Right mastectomy: Complex sclerosing lesion with UDH, PASH, 0/5 LN Neg   Mammaprint Result: High risk: Average 57 year old risk of recurrence untreated is a 29% without systemic chemotherapy Basal type  Adjuvant chemotherapy was recommended but patient did not want to undergo chemotherapy because she did not want to feel sick or lose her hair.  Treatment plan: Anastrozole 1 mg daily started 06/11/2017  Anastrozole toxicities: 1. Hot flashes 4-5 times  day and even at night. 2. diffuse pains: CT chest abdomen pelvis 09/18/2017: No evidence of metastatic disease.  Referral sent to Dr. Leland Johns

## 2018-03-18 NOTE — Assessment & Plan Note (Deleted)
Left mastectomy 03/30/2017: IDC 3.2 cm, DCIS, margins negative, 3/18 lymph nodes positive with focal extranodal extension, grade 2, ER 20%, PR 0%, HER-2 negative ratio 1.13, Ki-67 15% stage IIB  Right mastectomy: Complex sclerosing lesion with UDH, PASH, 0/5 LN Neg   Mammaprint Result: High risk: Average 57-year-old risk of recurrence untreated is a 29% without systemic chemotherapy Basal type  Adjuvant chemotherapy was recommended but patient did not want to undergo chemotherapy because she did not want to feel sick or lose her hair.  Treatment plan: Anastrozole 1 mg daily started 06/11/2017  Anastrozole toxicities: 1. Hot flashes 4-5 times  day and even at night. 2. diffuse pains: CT chest abdomen pelvis 09/18/2017: No evidence of metastatic disease.  Referral sent to Dr. Thimappa   

## 2018-03-19 ENCOUNTER — Ambulatory Visit: Payer: Managed Care, Other (non HMO) | Admitting: Hematology and Oncology

## 2018-03-19 NOTE — Assessment & Plan Note (Signed)
Left mastectomy 03/30/2017: IDC 3.2 cm, DCIS, margins negative, 3/18 lymph nodes positive with focal extranodal extension, grade 2, ER 20%, PR 0%, HER-2 negative ratio 1.13, Ki-67 15% stage IIB  Right mastectomy: Complex sclerosing lesion with UDH, PASH, 0/5 LN Neg   Mammaprint Result: High risk: Average 57-year-old risk of recurrence untreated is a 29% without systemic chemotherapy Basal type  Adjuvant chemotherapy was recommended but patient did not want to undergo chemotherapy because she did not want to feel sick or lose her hair.  Treatment plan: Anastrozole 1 mg daily started 06/11/2017  Anastrozole toxicities: 1. Hot flashes 4-5 times  day and even at night. 2. diffuse pains: CT chest abdomen pelvis 09/18/2017: No evidence of metastatic disease.  Referral sent to Dr. Thimappa   

## 2018-03-20 ENCOUNTER — Inpatient Hospital Stay: Payer: Managed Care, Other (non HMO) | Attending: Hematology and Oncology | Admitting: Hematology and Oncology

## 2018-03-20 ENCOUNTER — Telehealth: Payer: Self-pay

## 2018-03-20 DIAGNOSIS — C50412 Malignant neoplasm of upper-outer quadrant of left female breast: Secondary | ICD-10-CM | POA: Diagnosis not present

## 2018-03-20 DIAGNOSIS — R42 Dizziness and giddiness: Secondary | ICD-10-CM | POA: Insufficient documentation

## 2018-03-20 DIAGNOSIS — Z170 Estrogen receptor positive status [ER+]: Secondary | ICD-10-CM | POA: Diagnosis not present

## 2018-03-20 DIAGNOSIS — Z79811 Long term (current) use of aromatase inhibitors: Secondary | ICD-10-CM

## 2018-03-20 DIAGNOSIS — C773 Secondary and unspecified malignant neoplasm of axilla and upper limb lymph nodes: Secondary | ICD-10-CM | POA: Diagnosis not present

## 2018-03-20 DIAGNOSIS — H814 Vertigo of central origin: Secondary | ICD-10-CM

## 2018-03-20 NOTE — Progress Notes (Signed)
Patient Care Team: Celene Squibb, MD as PCP - General (Internal Medicine)  DIAGNOSIS:  Encounter Diagnoses  Name Primary?  . Malignant neoplasm of upper-outer quadrant of left breast in female, estrogen receptor positive ([place])   . Vertigo of central origin, unspecified laterality     SUMMARY OF ONCOLOGIC HISTORY:   Breast cancer of upper-outer quadrant of left female breast ([place])   01/02/2017 Mammogram    Diagnostic mammogram and US  IMPRESSION: 1. Findings highly suspicious for malignancy in the upper outer quadrant of the left breast, measuring 1.8 x 2.1 x 1.6 cm. Medially adjacent to this mass is a 6 mm oval nearly anechoic nodule which could be a cyst or tumor nodule   2. A suspicious left axillary lymph node. 3. Probable fibroadenoma upper-outer quadrant right breast at 10 o'clock. 4. Probably benign calcifications in the right breast.      01/30/2017 Initial Biopsy    Breast, left, needle core biopsy, upper outer 2:00 9 cm fn - INVASIVE DUCTAL CARCINOMA, SEE COMMENT. - DUCTAL CARCINOMA IN SITU. Microscopic Comment The carcinoma appears grade 2.      01/30/2017 Receptors her2    ER 20%+, weak staining, PR-, HER2-, Ki67 15%      03/30/2017 Surgery    Left mastectomy:  IDC 3.2 cm, DCIS, margins negative, 3/18 lymph nodes positive with focal extranodal extension, grade 2, ER 20%, PR 0%, HER-2 negative ratio 1.13, Ki-67 15% stage IIB Right mastectomy: Complex sclerosing lesion with UDH, PASH, 0/5 LN Neg       04/24/2017 Pathology Results    Mammaprint testing: High risk, 10 year risk of recurrence untreated 29%; 5 year distant metastases free interval 94.6% with came on hormonal therapy, basal type       Chemotherapy    Refused chemo      06/11/2017 -  Anti-estrogen oral therapy    Anastrozole 1 mg daily       CHIEF COMPLIANT: Follow-up on anastrozole therapy, complaining of severe dizziness, bilateral rib and abdominal pain  INTERVAL HISTORY: Margaret James is  a-year-old with above-mentioned history of left breast cancer treated with mastectomy and was found to be high risk by Mammaprint testing.  She refused chemotherapy and is currently on hormone therapy with anastrozole.  She appears to be tolerating anastrozole fairly well.  Patient tells me that she is feeling extremely anxious.  She is also having lots of pain in the ribs as well as abdominal pain.  She is also reporting dizziness and vertigo-like symptoms.  She is extremely paranoid that the cancer has returned.  REVIEW OF SYSTEMS:   Constitutional: Denies fevers, chills or abnormal weight loss Eyes: Denies blurriness of vision Ears, nose, mouth, throat, and face: Denies mucositis or sore throat Respiratory: Denies cough, dyspnea or wheezes Cardiovascular: Denies palpitation, chest discomfort Gastrointestinal:  Denies nausea, heartburn or change in bowel habits Skin: Denies abnormal skin rashes Lymphatics: Denies new lymphadenopathy or easy bruising Neurological: Dizziness and vertigo, bilateral rib and abdominal pains Behavioral/Psych: Mood is stable, no new changes  Extremities: No lower extremity edema  All other systems were reviewed with the patient and are negative.  I have reviewed the past medical history, past surgical history, social history and family history with the patient and they are unchanged from previous note.  ALLERGIES:  is allergic to lyrica [pregabalin].  MEDICATIONS:  Current Outpatient Medications  Medication Sig Dispense Refill  . albuterol (PROVENTIL HFA;VENTOLIN HFA) 108 (90 Base) MCG/ACT inhaler Inhale 2 puffs into  the lungs every 6 (six) hours as needed for wheezing or shortness of breath.    . ALPRAZolam (XANAX) 0.5 MG tablet Take 0.5 mg by mouth daily as needed for anxiety.    Marland Kitchen amLODipine (NORVASC) 5 MG tablet Take 1 tablet (5 mg total) by mouth daily. 30 tablet 0  . anastrozole (ARIMIDEX) 1 MG tablet Take 1 tablet (1 mg total) by mouth daily. 90 tablet 3   . buPROPion (WELLBUTRIN SR) 200 MG 12 hr tablet Take 200 mg by mouth daily.    . DULoxetine (CYMBALTA) 30 MG capsule Take 30 mg by mouth daily.    . Ginkgo Biloba (GINKOBA PO) Take 2 tablets by mouth daily.    . simvastatin (ZOCOR) 40 MG tablet Take 40 mg by mouth at bedtime.    . Tiotropium Bromide-Olodaterol (STIOLTO RESPIMAT) 2.5-2.5 MCG/ACT AERS Inhale 2 puffs into the lungs daily.    Marland Kitchen tiZANidine (ZANAFLEX) 4 MG tablet Take 4 mg by mouth every 8 (eight) hours as needed for muscle spasms.      No current facility-administered medications for this visit.     PHYSICAL EXAMINATION: ECOG PERFORMANCE STATUS: 1 - Symptomatic but completely ambulatory  Vitals:   03/20/18 1513  BP: (!) 145/93  Pulse: 72  Resp: 17  Temp: 97.8 F (36.6 C)  SpO2: 93%   Filed Weights   03/20/18 1513  Weight: 189 lb 12.8 oz (86.1 kg)    GENERAL:alert, no distress and comfortable SKIN: skin color, texture, turgor are normal, no rashes or significant lesions EYES: normal, Conjunctiva are pink and non-injected, sclera clear OROPHARYNX:no exudate, no erythema and lips, buccal mucosa, and tongue normal  NECK: supple, thyroid normal size, non-tender, without nodularity LYMPH:  no palpable lymphadenopathy in the cervical, axillary or inguinal LUNGS: clear to auscultation and percussion with normal breathing effort HEART: regular rate & rhythm and no murmurs and no lower extremity edema ABDOMEN:abdomen soft, non-tender and normal bowel sounds MUSCULOSKELETAL:no cyanosis of digits and no clubbing  NEURO: alert & oriented x 3 with fluent speech, no focal motor/sensory deficits EXTREMITIES: No lower extremity edema   LABORATORY DATA:  I have reviewed the data as listed CMP Latest Ref Rng & Units 09/11/2017 03/30/2017 03/28/2017  Glucose 70 - 140 mg/dl 100 - 94  BUN 7.0 - 26.0 mg/dL 15.5 - 8  Creatinine 0.6 - 1.1 mg/dL 1.0 0.86 0.82  Sodium 136 - 145 mEq/L 139 - 140  Potassium 3.5 - 5.1 mEq/L 4.3 - 4.9    Chloride 101 - 111 mmol/L - - 103  CO2 22 - 29 mEq/L 26 - 27  Calcium 8.4 - 10.4 mg/dL 10.0 - 9.4  Total Protein 6.4 - 8.3 g/dL 8.2 - -  Total Bilirubin 0.20 - 1.20 mg/dL 0.38 - -  Alkaline Phos 40 - 150 U/L 66 - -  AST 5 - 34 U/L 15 - -  ALT 0 - 55 U/L 18 - -    Lab Results  Component Value Date   WBC 8.1 09/11/2017   HGB 15.7 09/11/2017   HCT 46.8 (H) 09/11/2017   MCV 92.2 09/11/2017   PLT 334 09/11/2017   NEUTROABS 4.1 09/11/2017    ASSESSMENT & PLAN:  Breast cancer of upper-outer quadrant of left female breast ([place]) Left mastectomy 03/30/2017: IDC 3.2 cm, DCIS, margins negative, 3/18 lymph nodes positive with focal extranodal extension, grade 2, ER 20%, PR 0%, HER-2 negative ratio 1.13, Ki-67 15% stage IIB  Right mastectomy: Complex sclerosing lesion with UDH, PASH,  0/5 LN Neg   Mammaprint Result: High risk: Average 58 year old risk of recurrence untreated is a 29% without systemic chemotherapy Basal type  Adjuvant chemotherapy was recommended but patient did not want to undergo chemotherapy because she did not want to feel sick or lose her hair.  Treatment plan: Anastrozole 1 mg daily started 06/11/2017  Anastrozole toxicities: 1. Hot flashes 4-5 times  day and even at night. 2. diffuse pains: CT chest abdomen pelvis 09/18/2017: No evidence of metastatic disease. Bilateral rib and abdominal pains: I will obtain CT chest abdomen pelvis and bone scan to evaluate this further Severe dizziness and vertigo: We will obtain a brain MRI to evaluate this further.  Return to clinic after these test results if there are abnormalities.  If not I will see the patient back in 6 months.      Orders Placed This Encounter  Procedures  . MR Brain W Wo Contrast    Standing Status:   Future    Standing Expiration Date:   03/20/2019    Order Specific Question:   If indicated for the ordered procedure, I authorize the administration of contrast media per Radiology protocol     Answer:   Yes    Order Specific Question:   What is the patient's sedation requirement?    Answer:   No Sedation    Order Specific Question:   Does the patient have a pacemaker or implanted devices?    Answer:   No    Order Specific Question:   Radiology Contrast Protocol - do NOT remove file path    Answer:   \\[email or web address]    Order Specific Question:   Reason for Exam additional comments    Answer:   Severe dizziness with breast cancer    Order Specific Question:   Preferred imaging location?    Answer:   [facility] (table limit-350 lbs)  . CT Chest W Contrast    Standing Status:   Future    Standing Expiration Date:   03/20/2019    Order Specific Question:   If indicated for the ordered procedure, I authorize the administration of contrast media per Radiology protocol    Answer:   Yes    Order Specific Question:   Is patient pregnant?    Answer:   No    Order Specific Question:   Preferred imaging location?    Answer:   [facility]    Order Specific Question:   Radiology Contrast Protocol - do NOT remove file path    Answer:   \\[email or web address]    Order Specific Question:   Reason for Exam additional comments    Answer:   Bilateral chest and abd pain  . CT Abdomen Pelvis W Contrast    Standing Status:   Future    Standing Expiration Date:   03/20/2019    Order Specific Question:   If indicated for the ordered procedure, I authorize the administration of contrast media per Radiology protocol    Answer:   Yes    Order Specific Question:   Is patient pregnant?    Answer:   No    Order Specific Question:   Preferred imaging location?    Answer:   [facility]    Order Specific Question:   Radiology Contrast Protocol - do NOT remove file path    Answer:   \\[email or web address]    Order Specific Question:   Reason for Exam additional comments    Answer:  Bilateral chest and abd  pain  . NM Bone Scan Whole Body    Standing Status:   Future    Standing Expiration Date:   03/20/2019    Order Specific Question:   If indicated for the ordered procedure, I authorize the administration of a radiopharmaceutical per Radiology protocol    Answer:   Yes    Order Specific Question:   Is the patient pregnant?    Answer:   No    Order Specific Question:   Preferred imaging location?    Answer:   [facility]    Order Specific Question:   Radiology Contrast Protocol - do NOT remove file path    Answer:   \\[email or web address]    Order Specific Question:   Reason for Exam additional comments    Answer:   Bilateral rib pain   The patient has a good understanding of the overall plan. she agrees with it. she will call with any problems that may develop before the next visit here.   Harriette Ohara, MD 03/20/18

## 2018-03-20 NOTE — Telephone Encounter (Signed)
Called pt and was not home. Left message with daughter to have pt call back to discuss scan appts. Per Dr.Gudena, pt to have this done as soon as possible. Dates and appt times are listed in schedule.   April 13 at 12:30 Brain MRI  April 16 at 730am arrival,  830am Bone Scan and 11am                  8:30am CT CAP Npo after midnight, 1st contrast bottle at 630/ 2nd bottle 730

## 2018-03-20 NOTE — Addendum Note (Signed)
Addended by: Thelma Barge MAY J on: 03/20/2018 04:00 PM   Modules accepted: Orders

## 2018-03-21 ENCOUNTER — Telehealth: Payer: Self-pay | Admitting: Hematology and Oncology

## 2018-03-21 NOTE — Telephone Encounter (Signed)
Spoke to patient regarding upcoming April appointments.

## 2018-03-23 ENCOUNTER — Ambulatory Visit (HOSPITAL_COMMUNITY)
Admission: RE | Admit: 2018-03-23 | Discharge: 2018-03-23 | Disposition: A | Discharge status: Home / Self Care | Payer: Managed Care, Other (non HMO) | Source: Ambulatory Visit | Attending: Hematology and Oncology | Admitting: Hematology and Oncology

## 2018-03-23 DIAGNOSIS — J352 Hypertrophy of adenoids: Secondary | ICD-10-CM | POA: Insufficient documentation

## 2018-03-23 DIAGNOSIS — H8149 Vertigo of central origin, unspecified ear: Secondary | ICD-10-CM | POA: Diagnosis not present

## 2018-03-23 DIAGNOSIS — H814 Vertigo of central origin: Secondary | ICD-10-CM

## 2018-03-23 MED ORDER — GADOBENATE DIMEGLUMINE 529 MG/ML IV SOLN
20.0000 mL | Freq: Once | INTRAVENOUS | Status: AC | PRN
  Administered 2018-03-23: 18 mL via INTRAVENOUS

## 2018-03-25 ENCOUNTER — Telehealth: Payer: Self-pay | Admitting: Hematology and Oncology

## 2018-03-25 NOTE — Telephone Encounter (Signed)
I try to call the patient to give her the results of MRI brain. But the phone does not have a voicemail.  Could not leave a message.

## 2018-03-26 ENCOUNTER — Encounter (HOSPITAL_COMMUNITY): Payer: Managed Care, Other (non HMO)

## 2018-03-26 ENCOUNTER — Ambulatory Visit (HOSPITAL_COMMUNITY): Admission: RE | Admit: 2018-03-26 | Payer: Managed Care, Other (non HMO) | Source: Ambulatory Visit

## 2018-03-29 ENCOUNTER — Encounter (HOSPITAL_COMMUNITY): Admission: RE | Admit: 2018-03-29 | Payer: Managed Care, Other (non HMO) | Source: Ambulatory Visit

## 2018-03-29 ENCOUNTER — Encounter (HOSPITAL_COMMUNITY): Payer: Managed Care, Other (non HMO)

## 2018-03-29 ENCOUNTER — Ambulatory Visit (HOSPITAL_COMMUNITY): Admission: RE | Admit: 2018-03-29 | Payer: Managed Care, Other (non HMO) | Source: Ambulatory Visit

## 2018-04-01 ENCOUNTER — Inpatient Hospital Stay (HOSPITAL_BASED_OUTPATIENT_CLINIC_OR_DEPARTMENT_OTHER): Payer: Managed Care, Other (non HMO) | Admitting: Adult Health

## 2018-04-01 ENCOUNTER — Telehealth: Payer: Self-pay | Admitting: Hematology and Oncology

## 2018-04-01 ENCOUNTER — Encounter: Payer: Self-pay | Admitting: Adult Health

## 2018-04-01 VITALS — BP 143/101 | HR 69 | Temp 98.1°F | Resp 19 | Ht 66.0 in | Wt 189.5 lb

## 2018-04-01 DIAGNOSIS — C50412 Malignant neoplasm of upper-outer quadrant of left female breast: Secondary | ICD-10-CM | POA: Diagnosis not present

## 2018-04-01 DIAGNOSIS — Z170 Estrogen receptor positive status [ER+]: Secondary | ICD-10-CM

## 2018-04-01 DIAGNOSIS — Z79811 Long term (current) use of aromatase inhibitors: Secondary | ICD-10-CM | POA: Diagnosis not present

## 2018-04-01 DIAGNOSIS — R42 Dizziness and giddiness: Secondary | ICD-10-CM | POA: Diagnosis not present

## 2018-04-01 DIAGNOSIS — C773 Secondary and unspecified malignant neoplasm of axilla and upper limb lymph nodes: Secondary | ICD-10-CM | POA: Diagnosis not present

## 2018-04-01 NOTE — Telephone Encounter (Signed)
Gave avs and calendar ° °

## 2018-04-01 NOTE — Progress Notes (Signed)
[facility] Follow up:    Margaret Squibb, MD [place] [address]   DIAGNOSIS: Cancer Staging Breast cancer of upper-outer quadrant of left female breast [facility]) Staging form: Breast, AJCC 8th Edition - Clinical: Stage IIB (cT2, cN1, cM0, G2, ER: Positive, PR: Negative, HER2: Negative) - Unsigned   SUMMARY OF ONCOLOGIC HISTORY:   Breast cancer of upper-outer quadrant of left female breast ([place])   01/02/2017 Mammogram    Diagnostic mammogram and US  IMPRESSION: 1. Findings highly suspicious for malignancy in the upper outer quadrant of the left breast, measuring 1.8 x 2.1 x 1.6 cm. Medially adjacent to this mass is a 6 mm oval nearly anechoic nodule which could be a cyst or tumor nodule   2. A suspicious left axillary lymph node. 3. Probable fibroadenoma upper-outer quadrant right breast at 10 o'clock. 4. Probably benign calcifications in the right breast.      01/30/2017 Initial Biopsy    Breast, left, needle core biopsy, upper outer 2:00 9 cm fn - INVASIVE DUCTAL CARCINOMA, SEE COMMENT. - DUCTAL CARCINOMA IN SITU. Microscopic Comment The carcinoma appears grade 2.      01/30/2017 Receptors her2    ER 20%+, weak staining, PR-, HER2-, Ki67 15%      03/30/2017 Surgery    Left mastectomy:  IDC 3.2 cm, DCIS, margins negative, 3/18 lymph nodes positive with focal extranodal extension, grade 2, ER 20%, PR 0%, HER-2 negative ratio 1.13, Ki-67 15% stage IIB Right mastectomy: Complex sclerosing lesion with UDH, PASH, 0/5 LN Neg       04/24/2017 Pathology Results    Mammaprint testing: High risk, 10 year risk of recurrence untreated 29%; 5 year distant metastases free interval 94.6% with came on hormonal therapy, basal type       Chemotherapy    Refused chemo      06/11/2017 -  Anti-estrogen oral therapy    Anastrozole 1 mg daily       CURRENT THERAPY: Anastrozole daily  INTERVAL HISTORY: Margaret James 57 y.o. female returns for  evaluation of her estrogen positive breat cancer.  She is doing moderately well.  She was not feeling well last week so she canceled her CT scans, and will have them done on Apr 18, 2018.  She did undergo the brain MRI.  She has had vertigo x 2 months without relief.  She is concerned that cancer has caused it.    Patient Active Problem List   Diagnosis Date Noted  . Primary cancer of upper outer quadrant of left breast ([place]) 03/30/2017  . Chest pain 03/05/2017  . Tobacco abuse 03/05/2017  . HLD (hyperlipidemia)   . Depression   . Breast cancer of upper-outer quadrant of left female breast ([place])   . COPD (chronic obstructive pulmonary disease) ([facility])     is allergic to lyrica [pregabalin].  MEDICAL HISTORY: Past Medical History:  Diagnosis Date  . Anxiety    takes Xanax daily as needed  . Arthritis    "knees, back" (03/30/2017)  . Breast cancer ([place])    Left, ER positive IDC with DCIS   . Chronic lower back pain    DDD  . COPD (chronic obstructive pulmonary disease) ([facility])    Stiolto daily and Albuterol as needed  . Depression    takes Cymbalta and Wellbutrin daily  . Dyspnea    with exertion  . Emphysema lung ([place])   . Gout   . HLD (hyperlipidemia)  takes Simvastatin daily  . Hypertension    takes Amlodipine daily  . Melanoma of back ([place])   . Muscle spasm    takes Zanaflex daily as needed   . Stress incontinence   . Tobacco abuse   . Uterine cancer ([place])   . Weakness    numbness in hands    SURGICAL HISTORY: Past Surgical History:  Procedure Laterality Date  . BREAST BIOPSY Left 12/2016  . MASTECTOMY COMPLETE / SIMPLE Right 03/30/2017  . MASTECTOMY MODIFIED RADICAL Left 03/30/2017  . MASTECTOMY MODIFIED RADICAL Left 03/30/2017   Procedure: LEFT MASTECTOMY MODIFIED RADICAL;  Surgeon: Fanny Skates, MD;  Location: [facility];  Service: General;  Laterality: Left;  Marland Kitchen MELANOMA EXCISION     "back"  . SIMPLE MASTECTOMY WITH AXILLARY SENTINEL NODE BIOPSY Right 03/30/2017    Procedure: RIGHT SIMPLE MASTECTOMY;  Surgeon: Fanny Skates, MD;  Location: [place];  Service: General;  Laterality: Right;  . TONSILLECTOMY  1977  . VAGINAL HYSTERECTOMY      SOCIAL HISTORY: Social History   Socioeconomic History  . Marital status: Married    Spouse name: Not on file  . Number of children: Not on file  . Years of education: Not on file  . Highest education level: Not on file  Occupational History  . Not on file  Social Needs  . Financial resource strain: Not on file  . Food insecurity:    Worry: Not on file    Inability: Not on file  . Transportation needs:    Medical: Not on file    Non-medical: Not on file  Tobacco Use  . Smoking status: Current Every Day Smoker    Packs/day: 1.50    Years: 41.00    Pack years: 61.50    Types: Cigarettes  . Smokeless tobacco: Never Used  Substance and Sexual Activity  . Alcohol use: No  . Drug use: Yes    Types: Marijuana    Comment: last smoke 05-14-17  . Sexual activity: Not on file  Lifestyle  . Physical activity:    Days per week: Not on file    Minutes per session: Not on file  . Stress: Not on file  Relationships  . Social connections:    Talks on phone: Not on file    Gets together: Not on file    Attends religious service: Not on file    Active member of club or organization: Not on file    Attends meetings of clubs or organizations: Not on file    Relationship status: Not on file  . Intimate partner violence:    Fear of current or ex partner: Not on file    Emotionally abused: Not on file    Physically abused: Not on file    Forced sexual activity: Not on file  Other Topics Concern  . Not on file  Social History Narrative  . Not on file    FAMILY HISTORY: Family History  Problem Relation Age of Onset  . COPD Mother   . Heart disease Father   . Lung cancer Sister     Review of Systems  Constitutional: Negative for appetite change, chills, fatigue, fever and unexpected weight change.   HENT:   Negative for hearing loss, lump/mass and trouble swallowing.   Eyes: Negative for eye problems and icterus.  Respiratory: Negative for chest tightness, cough and shortness of breath.   Cardiovascular: Negative for chest pain, leg swelling and palpitations.  Gastrointestinal: Negative for abdominal distention,  abdominal pain, constipation, diarrhea, nausea and vomiting.  Endocrine: Positive for hot flashes.  Skin: Negative for itching and rash.  Neurological: Positive for dizziness. Negative for extremity weakness, headaches, light-headedness and numbness.  Psychiatric/Behavioral: Negative for depression. The patient is not nervous/anxious.       PHYSICAL EXAMINATION  ECOG PERFORMANCE STATUS: 1 - Symptomatic but completely ambulatory  Vitals:   04/01/18 1344  BP: (!) 143/101  Pulse: 69  Resp: 19  Temp: 98.1 F (36.7 C)  SpO2: 93%    Physical Exam  Constitutional: She is oriented to person, place, and time and well-developed, well-nourished, and in no distress.  HENT:  Head: Normocephalic and atraumatic.  Mouth/Throat: Oropharynx is clear and moist. No oropharyngeal exudate.  Patient unable to lie down due to room spinning sensation when she was laying down with her head turned to the right  Eyes: Pupils are equal, round, and reactive to light. No scleral icterus.  Neck: Neck supple.  Cardiovascular: Normal rate, regular rhythm and normal heart sounds.  Pulmonary/Chest: Effort normal and breath sounds normal.  Abdominal: Soft. Bowel sounds are normal.  Musculoskeletal: She exhibits no edema.  Lymphadenopathy:    She has no cervical adenopathy.  Neurological: She is alert and oriented to person, place, and time.  Skin: Skin is warm and dry. No rash noted.  Psychiatric: Mood and affect normal.    LABORATORY DATA:  CBC    Component Value Date/Time   WBC 8.1 09/11/2017 1334   WBC 10.6 (H) 03/30/2017 1900   RBC 5.08 09/11/2017 1334   RBC 4.73 03/30/2017 1900    HGB 15.7 09/11/2017 1334   HCT 46.8 (H) 09/11/2017 1334   PLT 334 09/11/2017 1334   MCV 92.2 09/11/2017 1334   MCH 31.0 09/11/2017 1334   MCH 31.5 03/30/2017 1900   MCHC 33.6 09/11/2017 1334   MCHC 33.6 03/30/2017 1900   RDW 13.7 09/11/2017 1334   LYMPHSABS 3.0 09/11/2017 1334   MONOABS 0.8 09/11/2017 1334   EOSABS 0.1 09/11/2017 1334   BASOSABS 0.1 09/11/2017 1334    CMP     Component Value Date/Time   NA 139 09/11/2017 1334   K 4.3 09/11/2017 1334   CL 103 03/28/2017 1133   CO2 26 09/11/2017 1334   GLUCOSE 100 09/11/2017 1334   BUN 15.5 09/11/2017 1334   CREATININE 1.0 09/11/2017 1334   CALCIUM 10.0 09/11/2017 1334   PROT 8.2 09/11/2017 1334   ALBUMIN 4.5 09/11/2017 1334   AST 15 09/11/2017 1334   ALT 18 09/11/2017 1334   ALKPHOS 66 09/11/2017 1334   BILITOT 0.38 09/11/2017 1334   GFRNONAA >60 03/30/2017 1900   GFRAA >60 03/30/2017 1900    ASSESSMENT and PLAN:   Breast cancer of upper-outer quadrant of left female breast ([place]) Left mastectomy 03/30/2017: IDC 3.2 cm, DCIS, margins negative, 3/18 lymph nodes positive with focal extranodal extension, grade 2, ER 20%, PR 0%, HER-2 negative ratio 1.13, Ki-67 15% stage IIB  Right mastectomy: Complex sclerosing lesion with UDH, PASH, 0/5 LN Neg   Mammaprint Result: High risk: Average 57 year old risk of recurrence untreated is a 29% without systemic chemotherapy Basal type  Adjuvant chemotherapy was recommended but patient did not want to undergo chemotherapy because she did not want to feel sick or lose her hair.  Treatment plan: Anastrozole 1 mg daily started 06/11/2017  Margaret James continued on Anastrozole with continued but manageable hot flashes.  Her MRI has no evidence of metastatic disease.  This is good news.  There are some other incidental findings such as enlarged pituitary gland, and cyst noted.  I will await Dr. Geralyn Flash return from vacation to review with him.  In the interim, I will refer her to vestibular  rehab for treatment.    Margaret James will return on 5/13 after she completes the rest of her scans to undergo f/u with Dr. Lindi Adie.        Orders Placed This Encounter  Procedures  . Ambulatory referral to Physical Therapy    Referral Priority:   Urgent    Referral Type:   Physical Medicine    Referral Reason:   Specialty Services Required    Requested Specialty:   Physical Therapy    Number of Visits Requested:   1    All questions were answered. The patient knows to call the clinic with any problems, questions or concerns. We can certainly see the patient much sooner if necessary.  A total of (30) minutes of face-to-face time was spent with this patient with greater than 50% of that time in counseling and care-coordination.  This note was electronically signed. Scot Dock, NP 04/01/2018

## 2018-04-01 NOTE — Assessment & Plan Note (Signed)
Left mastectomy 03/30/2017: IDC 3.2 cm, DCIS, margins negative, 3/18 lymph nodes positive with focal extranodal extension, grade 2, ER 20%, PR 0%, HER-2 negative ratio 1.13, Ki-67 15% stage IIB  Right mastectomy: Complex sclerosing lesion with UDH, PASH, 0/5 LN Neg   Mammaprint Result: High risk: Average 57 year old risk of recurrence untreated is a 29% without systemic chemotherapy Basal type  Adjuvant chemotherapy was recommended but patient did not want to undergo chemotherapy because she did not want to feel sick or lose her hair.  Treatment plan: Anastrozole 1 mg daily started 06/11/2017  Margaret James continued on Anastrozole with continued but manageable hot flashes.  Her MRI has no evidence of metastatic disease.  This is good news.  There are some other incidental findings such as enlarged pituitary gland, and cyst noted.  I will await Dr. Geralyn Flash return from vacation to review with him.  In the interim, I will refer her to vestibular rehab for treatment.    Margaret James will return on 5/13 after she completes the rest of her scans to undergo f/u with Dr. Lindi Adie.

## 2018-04-18 ENCOUNTER — Ambulatory Visit (HOSPITAL_COMMUNITY)
Admission: RE | Admit: 2018-04-18 | Discharge: 2018-04-18 | Disposition: A | Discharge status: Home / Self Care | Payer: Managed Care, Other (non HMO) | Source: Ambulatory Visit | Attending: Hematology and Oncology | Admitting: Hematology and Oncology

## 2018-04-18 ENCOUNTER — Encounter (HOSPITAL_COMMUNITY)
Admission: RE | Admit: 2018-04-18 | Discharge: 2018-04-18 | Disposition: A | Discharge status: Home / Self Care | Payer: Managed Care, Other (non HMO) | Source: Ambulatory Visit | Attending: Hematology and Oncology | Admitting: Hematology and Oncology

## 2018-04-18 DIAGNOSIS — C50412 Malignant neoplasm of upper-outer quadrant of left female breast: Secondary | ICD-10-CM | POA: Diagnosis not present

## 2018-04-18 DIAGNOSIS — I700 Atherosclerosis of aorta: Secondary | ICD-10-CM | POA: Diagnosis not present

## 2018-04-18 DIAGNOSIS — C541 Malignant neoplasm of endometrium: Secondary | ICD-10-CM | POA: Diagnosis not present

## 2018-04-18 DIAGNOSIS — H814 Vertigo of central origin: Secondary | ICD-10-CM

## 2018-04-18 DIAGNOSIS — K429 Umbilical hernia without obstruction or gangrene: Secondary | ICD-10-CM | POA: Diagnosis not present

## 2018-04-18 DIAGNOSIS — Z170 Estrogen receptor positive status [ER+]: Secondary | ICD-10-CM | POA: Diagnosis not present

## 2018-04-18 DIAGNOSIS — R079 Chest pain, unspecified: Secondary | ICD-10-CM | POA: Diagnosis not present

## 2018-04-18 DIAGNOSIS — J439 Emphysema, unspecified: Secondary | ICD-10-CM | POA: Diagnosis not present

## 2018-04-18 DIAGNOSIS — C50919 Malignant neoplasm of unspecified site of unspecified female breast: Secondary | ICD-10-CM | POA: Diagnosis not present

## 2018-04-18 DIAGNOSIS — R109 Unspecified abdominal pain: Secondary | ICD-10-CM | POA: Diagnosis not present

## 2018-04-18 MED ORDER — IOHEXOL 300 MG/ML  SOLN
100.0000 mL | Freq: Once | INTRAMUSCULAR | Status: AC | PRN
  Administered 2018-04-18: 100 mL via INTRAVENOUS

## 2018-04-18 MED ORDER — TECHNETIUM TC 99M MEDRONATE IV KIT
19.6000 | PACK | Freq: Once | INTRAVENOUS | Status: AC | PRN
  Administered 2018-04-18: 19.6 via INTRAVENOUS

## 2018-04-19 ENCOUNTER — Ambulatory Visit: Payer: Managed Care, Other (non HMO)

## 2018-04-19 ENCOUNTER — Ambulatory Visit: Payer: Managed Care, Other (non HMO) | Admitting: Physical Therapy

## 2018-04-22 ENCOUNTER — Inpatient Hospital Stay: Payer: Managed Care, Other (non HMO) | Attending: Hematology and Oncology | Admitting: Hematology and Oncology

## 2018-04-22 ENCOUNTER — Telehealth: Payer: Self-pay | Admitting: Hematology and Oncology

## 2018-04-22 NOTE — Telephone Encounter (Signed)
I discussed with the patient the CT scans do not show any evidence of cancer. She feels extremely ill because of her viral upper respiratory infection. I will see her back in 3 to 4 months.

## 2018-04-23 ENCOUNTER — Telehealth: Payer: Self-pay | Admitting: Hematology and Oncology

## 2018-04-23 NOTE — Telephone Encounter (Signed)
Mailed patient calendar of upcoming September appointments per 5/13 sch message

## 2018-04-25 ENCOUNTER — Ambulatory Visit: Payer: Managed Care, Other (non HMO) | Attending: Adult Health | Admitting: Physical Therapy

## 2018-05-01 ENCOUNTER — Other Ambulatory Visit: Payer: Self-pay

## 2018-05-01 DIAGNOSIS — C50412 Malignant neoplasm of upper-outer quadrant of left female breast: Secondary | ICD-10-CM

## 2018-05-02 ENCOUNTER — Telehealth: Payer: Self-pay

## 2018-05-02 ENCOUNTER — Telehealth: Payer: Self-pay | Admitting: Hematology and Oncology

## 2018-05-02 NOTE — Telephone Encounter (Signed)
Scheduled appt per 5/22 sch msg - told pt re appts, they did not understand why they needed the appointment and I transferred to the desk nurse.

## 2018-05-02 NOTE — Telephone Encounter (Signed)
Mailed patient calendar of upcoming September appointments per 5/23 sch message

## 2018-05-02 NOTE — Telephone Encounter (Signed)
Returned pt's call, she said she didn't need appointment for tomorrow, was for in 3-4 months. Confirmed by MD notes and Dr Lindi Adie.  Will cancel tomorrow's lab/appt and send message to scheduling to reschedule for in 3-4 months and request they call pt with appt/time/date.  No other needs per pt at this time.

## 2018-05-03 ENCOUNTER — Other Ambulatory Visit: Payer: Managed Care, Other (non HMO)

## 2018-05-03 ENCOUNTER — Ambulatory Visit: Payer: Managed Care, Other (non HMO) | Admitting: Hematology and Oncology

## 2018-05-14 ENCOUNTER — Ambulatory Visit: Payer: Managed Care, Other (non HMO) | Admitting: Physical Therapy

## 2018-05-23 DIAGNOSIS — E785 Hyperlipidemia, unspecified: Secondary | ICD-10-CM | POA: Diagnosis not present

## 2018-05-23 DIAGNOSIS — R7301 Impaired fasting glucose: Secondary | ICD-10-CM | POA: Diagnosis not present

## 2018-05-28 ENCOUNTER — Other Ambulatory Visit: Payer: Self-pay | Admitting: Hematology and Oncology

## 2018-05-31 ENCOUNTER — Ambulatory Visit: Payer: Managed Care, Other (non HMO) | Admitting: Physical Therapy

## 2018-06-03 DIAGNOSIS — F17200 Nicotine dependence, unspecified, uncomplicated: Secondary | ICD-10-CM | POA: Diagnosis not present

## 2018-06-03 DIAGNOSIS — G8929 Other chronic pain: Secondary | ICD-10-CM | POA: Diagnosis not present

## 2018-06-03 DIAGNOSIS — F419 Anxiety disorder, unspecified: Secondary | ICD-10-CM | POA: Diagnosis not present

## 2018-06-03 DIAGNOSIS — C50919 Malignant neoplasm of unspecified site of unspecified female breast: Secondary | ICD-10-CM | POA: Diagnosis not present

## 2018-06-03 DIAGNOSIS — E782 Mixed hyperlipidemia: Secondary | ICD-10-CM | POA: Diagnosis not present

## 2018-06-03 DIAGNOSIS — M545 Low back pain: Secondary | ICD-10-CM | POA: Diagnosis not present

## 2018-06-03 DIAGNOSIS — J449 Chronic obstructive pulmonary disease, unspecified: Secondary | ICD-10-CM | POA: Diagnosis not present

## 2018-06-03 DIAGNOSIS — I10 Essential (primary) hypertension: Secondary | ICD-10-CM | POA: Diagnosis not present

## 2018-06-10 DIAGNOSIS — F17200 Nicotine dependence, unspecified, uncomplicated: Secondary | ICD-10-CM | POA: Diagnosis not present

## 2018-06-10 DIAGNOSIS — C50919 Malignant neoplasm of unspecified site of unspecified female breast: Secondary | ICD-10-CM | POA: Diagnosis not present

## 2018-06-10 DIAGNOSIS — F419 Anxiety disorder, unspecified: Secondary | ICD-10-CM | POA: Diagnosis not present

## 2018-06-10 DIAGNOSIS — J449 Chronic obstructive pulmonary disease, unspecified: Secondary | ICD-10-CM | POA: Diagnosis not present

## 2018-06-24 ENCOUNTER — Ambulatory Visit: Payer: Managed Care, Other (non HMO) | Admitting: Physical Therapy

## 2018-07-02 ENCOUNTER — Other Ambulatory Visit: Payer: Self-pay

## 2018-07-02 DIAGNOSIS — Z170 Estrogen receptor positive status [ER+]: Principal | ICD-10-CM

## 2018-07-02 DIAGNOSIS — C50412 Malignant neoplasm of upper-outer quadrant of left female breast: Secondary | ICD-10-CM

## 2018-07-02 MED ORDER — ANASTROZOLE 1 MG PO TABS: 1.0000 mg | ORAL_TABLET | Freq: Every day | ORAL | 0 refills | Status: DC

## 2018-07-02 NOTE — Telephone Encounter (Signed)
Received a refill request from [facility] mail delivery regarding anastrozole.  Pt's usual pharmacy is [facility]  Called pt and she would like to try the mail order thru [facility] for her anastrozole. She reported some of her meds would not go thru home delivery per her PCP but she would like to try this service with anastrozole. Refill submitted for [facility] mail order as pt requested.  No other needs per pt at this time.

## 2018-07-03 ENCOUNTER — Ambulatory Visit: Payer: Managed Care, Other (non HMO) | Admitting: Rehabilitative and Restorative Service Providers"

## 2018-07-04 ENCOUNTER — Other Ambulatory Visit: Payer: Self-pay

## 2018-07-04 DIAGNOSIS — C50412 Malignant neoplasm of upper-outer quadrant of left female breast: Secondary | ICD-10-CM

## 2018-07-04 DIAGNOSIS — Z170 Estrogen receptor positive status [ER+]: Principal | ICD-10-CM

## 2018-07-04 MED ORDER — ANASTROZOLE 1 MG PO TABS: 1.0000 mg | ORAL_TABLET | Freq: Every day | ORAL | 0 refills | Status: DC

## 2018-07-11 ENCOUNTER — Other Ambulatory Visit: Payer: Self-pay | Admitting: *Deleted

## 2018-07-11 DIAGNOSIS — C50412 Malignant neoplasm of upper-outer quadrant of left female breast: Secondary | ICD-10-CM

## 2018-07-11 DIAGNOSIS — Z170 Estrogen receptor positive status [ER+]: Principal | ICD-10-CM

## 2018-07-11 MED ORDER — ANASTROZOLE 1 MG PO TABS: 1.0000 mg | ORAL_TABLET | Freq: Every day | ORAL | 0 refills | Status: DC

## 2018-07-24 ENCOUNTER — Ambulatory Visit
Payer: Managed Care, Other (non HMO) | Attending: Adult Health | Admitting: Rehabilitative and Restorative Service Providers"

## 2018-07-31 ENCOUNTER — Encounter (HOSPITAL_COMMUNITY): Payer: Self-pay | Admitting: Emergency Medicine

## 2018-07-31 ENCOUNTER — Emergency Department (HOSPITAL_COMMUNITY): Payer: Managed Care, Other (non HMO)

## 2018-07-31 ENCOUNTER — Other Ambulatory Visit: Payer: Self-pay

## 2018-07-31 ENCOUNTER — Emergency Department (HOSPITAL_COMMUNITY)
Admission: EM | Admit: 2018-07-31 | Discharge: 2018-07-31 | Disposition: A | Discharge status: Home / Self Care | Payer: Managed Care, Other (non HMO) | Attending: Emergency Medicine | Admitting: Emergency Medicine

## 2018-07-31 DIAGNOSIS — Z79899 Other long term (current) drug therapy: Secondary | ICD-10-CM | POA: Diagnosis not present

## 2018-07-31 DIAGNOSIS — I10 Essential (primary) hypertension: Secondary | ICD-10-CM | POA: Diagnosis not present

## 2018-07-31 DIAGNOSIS — R0789 Other chest pain: Secondary | ICD-10-CM | POA: Diagnosis not present

## 2018-07-31 DIAGNOSIS — R079 Chest pain, unspecified: Secondary | ICD-10-CM | POA: Diagnosis not present

## 2018-07-31 DIAGNOSIS — Z9011 Acquired absence of right breast and nipple: Secondary | ICD-10-CM | POA: Diagnosis not present

## 2018-07-31 DIAGNOSIS — Z853 Personal history of malignant neoplasm of breast: Secondary | ICD-10-CM | POA: Insufficient documentation

## 2018-07-31 DIAGNOSIS — R001 Bradycardia, unspecified: Secondary | ICD-10-CM | POA: Diagnosis not present

## 2018-07-31 DIAGNOSIS — F17210 Nicotine dependence, cigarettes, uncomplicated: Secondary | ICD-10-CM | POA: Insufficient documentation

## 2018-07-31 DIAGNOSIS — Z85828 Personal history of other malignant neoplasm of skin: Secondary | ICD-10-CM | POA: Diagnosis not present

## 2018-07-31 DIAGNOSIS — Z8541 Personal history of malignant neoplasm of cervix uteri: Secondary | ICD-10-CM | POA: Insufficient documentation

## 2018-07-31 DIAGNOSIS — R072 Precordial pain: Secondary | ICD-10-CM | POA: Diagnosis not present

## 2018-07-31 DIAGNOSIS — J449 Chronic obstructive pulmonary disease, unspecified: Secondary | ICD-10-CM | POA: Insufficient documentation

## 2018-07-31 LAB — CBC
HCT: 44.7 % (ref 36.0–46.0)
Hemoglobin: 14.5 g/dL (ref 12.0–15.0)
MCH: 30.7 pg (ref 26.0–34.0)
MCHC: 32.4 g/dL (ref 30.0–36.0)
MCV: 94.7 fL (ref 78.0–100.0)
Platelets: 326 10*3/uL (ref 150–400)
RBC: 4.72 MIL/uL (ref 3.87–5.11)
RDW: 13.9 % (ref 11.5–15.5)
WBC: 7.2 10*3/uL (ref 4.0–10.5)

## 2018-07-31 LAB — I-STAT BETA HCG BLOOD, ED (MC, WL, AP ONLY): I-stat hCG, quantitative: <5 m[IU]/mL (ref <5)

## 2018-07-31 LAB — BASIC METABOLIC PANEL
Anion gap: 11 (ref 5–15)
BUN: 10 mg/dL (ref 6–20)
CO2: 26 mmol/L (ref 22–32)
Calcium: 9.6 mg/dL (ref 8.9–10.3)
Chloride: 102 mmol/L (ref 98–111)
Creatinine, Ser: 0.94 mg/dL (ref 0.44–1.00)
GFR calc Af Amer: >60 mL/min (ref >60)
GFR calc non Af Amer: >60 mL/min (ref >60)
Glucose, Bld: 107 mg/dL — ABNORMAL HIGH (ref 70–99)
Potassium: 4 mmol/L (ref 3.5–5.1)
Sodium: 139 mmol/L (ref 135–145)

## 2018-07-31 LAB — I-STAT TROPONIN, ED: Troponin i, poc: 0 ng/mL (ref 0.00–0.08)

## 2018-07-31 NOTE — ED Provider Notes (Signed)
[facility] EMERGENCY DEPARTMENT Provider Note   CSN: [id number] Arrival date & time: 07/31/18  2032     History   Chief Complaint Chief Complaint  Patient presents with  . Chest Pain    HPI Margaret James is a 57 y.o. female.  Patient complains of chest pain that lasted for about 20 minutes.  No sweating no shortness of breath.  The history is provided by the patient. No language interpreter was used.  Chest Pain   This is a new problem. The current episode started less than 1 hour ago. The problem occurs constantly. The problem has not changed since onset.The pain is associated with exertion. The pain is present in the substernal region. The pain is at a severity of 3/10. The pain is moderate. The quality of the pain is described as sharp. Pertinent negatives include no abdominal pain, no back pain, no cough and no headaches.  Pertinent negatives for past medical history include no seizures.    Past Medical History:  Diagnosis Date  . Anxiety    takes Xanax daily as needed  . Arthritis    "knees, back" (03/30/2017)  . Breast cancer ([place])    Left, ER positive IDC with DCIS   . Chronic lower back pain    DDD  . COPD (chronic obstructive pulmonary disease) (HCC)    Stiolto daily and Albuterol as needed  . Depression    takes Cymbalta and Wellbutrin daily  . Dyspnea    with exertion  . Emphysema lung ([place])   . Gout   . HLD (hyperlipidemia)    takes Simvastatin daily  . Hypertension    takes Amlodipine daily  . Melanoma of back ([place])   . Muscle spasm    takes Zanaflex daily as needed   . Stress incontinence   . Tobacco abuse   . Uterine cancer ([place])   . Weakness    numbness in hands    Patient Active Problem List   Diagnosis Date Noted  . Primary cancer of upper outer quadrant of left breast ([place]) 03/30/2017  . Chest pain 03/05/2017  . Tobacco abuse 03/05/2017  . HLD (hyperlipidemia)   . Depression   . Breast cancer of upper-outer  quadrant of left female breast ([place])   . COPD (chronic obstructive pulmonary disease) ([place])     Past Surgical History:  Procedure Laterality Date  . BREAST BIOPSY Left 12/2016  . MASTECTOMY COMPLETE / SIMPLE Right 03/30/2017  . MASTECTOMY MODIFIED RADICAL Left 03/30/2017  . MASTECTOMY MODIFIED RADICAL Left 03/30/2017   Procedure: LEFT MASTECTOMY MODIFIED RADICAL;  Surgeon: Fanny Skates, MD;  Location: [facility];  Service: General;  Laterality: Left;  Marland Kitchen MELANOMA EXCISION     "back"  . SIMPLE MASTECTOMY WITH AXILLARY SENTINEL NODE BIOPSY Right 03/30/2017   Procedure: RIGHT SIMPLE MASTECTOMY;  Surgeon: Fanny Skates, MD;  Location: [facility];  Service: General;  Laterality: Right;  . TONSILLECTOMY  1977  . VAGINAL HYSTERECTOMY       OB History   None      Home Medications    Prior to Admission medications   Medication Sig Start Date End Date Taking? Authorizing Provider  albuterol (PROVENTIL HFA;VENTOLIN HFA) 108 (90 Base) MCG/ACT inhaler Inhale 2 puffs into the lungs every 6 (six) hours as needed for wheezing or shortness of breath.    [provider]  ALPRAZolam Duanne Moron) 0.5 MG tablet Take 0.5 mg by mouth daily as needed for anxiety. 02/21/17   [provider]  amLODipine (NORVASC) 5 MG tablet Take 1 tablet (5 mg total) by mouth daily. 03/05/17   Geradine Girt, DO  anastrozole (ARIMIDEX) 1 MG tablet Take 1 tablet (1 mg total) by mouth daily. 07/11/18   Nicholas Lose, MD  buPROPion (WELLBUTRIN SR) 200 MG 12 hr tablet Take 200 mg by mouth daily. 01/16/17   [provider]  cloNIDine (CATAPRES) 0.1 MG tablet  02/25/18   [provider]  DULoxetine (CYMBALTA) 30 MG capsule Take 30 mg by mouth daily. 01/20/17   [provider]  Ginkgo Biloba (GINKOBA PO) Take 2 tablets by mouth daily.    [provider]  oxyCODONE-acetaminophen (PERCOCET) 7.5-325 MG tablet  02/26/18   [provider]  simvastatin (ZOCOR) 40 MG tablet Take 40 mg by mouth  at bedtime. 02/05/17   [provider]  Tiotropium Bromide-Olodaterol (STIOLTO RESPIMAT) 2.5-2.5 MCG/ACT AERS Inhale 2 puffs into the lungs daily.    [provider]  tiZANidine (ZANAFLEX) 4 MG tablet Take 4 mg by mouth every 8 (eight) hours as needed for muscle spasms.  02/05/17   [provider]    Family History Family History  Problem Relation Age of Onset  . COPD Mother   . Heart disease Father   . Lung cancer Sister     Social History Social History   Tobacco Use  . Smoking status: Current Every Day Smoker    Packs/day: 1.50    Years: 41.00    Pack years: 61.50    Types: Cigarettes  . Smokeless tobacco: Never Used  Substance Use Topics  . Alcohol use: No  . Drug use: Yes    Types: Marijuana    Comment: last smoke 05-14-17     Allergies   Lyrica [pregabalin]   Review of Systems Review of Systems  Constitutional: Negative for appetite change and fatigue.  HENT: Negative for congestion, ear discharge and sinus pressure.   Eyes: Negative for discharge.  Respiratory: Negative for cough.   Cardiovascular: Positive for chest pain.  Gastrointestinal: Negative for abdominal pain and diarrhea.  Genitourinary: Negative for frequency and hematuria.  Musculoskeletal: Negative for back pain.  Skin: Negative for rash.  Neurological: Negative for seizures and headaches.  Psychiatric/Behavioral: Negative for hallucinations.     Physical Exam Updated Vital Signs BP 125/82   Pulse 81   Temp 98.4 F (36.9 C) (Oral)   Resp 15   SpO2 90%   Physical Exam  Constitutional: She is oriented to person, place, and time. She appears well-developed.  HENT:  Head: Normocephalic.  Eyes: Conjunctivae and EOM are normal. No scleral icterus.  Neck: Neck supple. No thyromegaly present.  Cardiovascular: Normal rate and regular rhythm. Exam reveals no gallop and no friction rub.  No murmur heard. Pulmonary/Chest: No stridor. She has no wheezes. She has no  rales. She exhibits no tenderness.  Abdominal: She exhibits no distension. There is no tenderness. There is no rebound.  Musculoskeletal: Normal range of motion. She exhibits no edema.  Lymphadenopathy:    She has no cervical adenopathy.  Neurological: She is oriented to person, place, and time. She exhibits normal muscle tone. Coordination normal.  Skin: No rash noted. No erythema.  Psychiatric: She has a normal mood and affect. Her behavior is normal.     ED Treatments / Results  Labs (all labs ordered are listed, but only abnormal results are displayed) Labs Reviewed  BASIC METABOLIC PANEL - Abnormal; Notable for the following components:  Result Value   Glucose, Bld 107 (*)    All other components within normal limits  CBC  I-STAT TROPONIN, ED  I-STAT BETA HCG BLOOD, ED ([facility], [facility], [facility] ONLY)    EKG EKG Interpretation  Date/Time:  Wednesday July 31 2018 20:36:46 EDT Ventricular Rate:  85 PR Interval:    QRS Duration: 93 QT Interval:  356 QTC Calculation: 424 R Axis:   -18 Text Interpretation:  Sinus rhythm Borderline left axis deviation Low voltage, precordial leads Abnormal R-wave progression, early transition Confirmed by Milton Ferguson [phone]) on 07/31/2018 9:36:55 PM   Radiology Dg Chest 2 View  Result Date: 07/31/2018 CLINICAL DATA:  Chest pain EXAM: CHEST - 2 VIEW COMPARISON:  04/18/2018 FINDINGS: Cardiac shadows within normal limits. Lungs are well aerated bilaterally. Postsurgical changes are noted on the left consistent with the given clinical history. No focal infiltrate or sizable effusion is seen. No acute bony abnormality is noted. IMPRESSION: No active cardiopulmonary disease. Electronically Signed   By: Inez Catalina M.D.   On: 07/31/2018 21:29    Procedures Procedures (including critical care time)  Medications Ordered in ED Medications - No data to display   Initial Impression / Assessment and Plan / ED Course  I have reviewed the triage vital  signs and the nursing notes.  Pertinent labs & imaging results that were available during my care of the patient were reviewed by me and considered in my medical decision making (see chart for details).     Patient with chest pain labs unremarkable EKG shows no acute changes.  Troponin initially was normal.  I spoke with the patient and felt like she needed a second troponin.  She refused to stay.  I explained to her that sometimes heart enzyme will elevate in a couple hours and we can be certain this pain was not coming from your heart but she decided to leave anyway  Final Clinical Impressions(s) / ED Diagnoses   Final diagnoses:  Atypical chest pain    ED Discharge Orders    None       Milton Ferguson, MD 07/31/18 2310

## 2018-07-31 NOTE — Discharge Instructions (Signed)
Follow up with your md for recheck °

## 2018-07-31 NOTE — ED Triage Notes (Signed)
Pt arrives to ED from home with complaints of chest pain that lasted about 5 minutes. EMS reports when they arrived at the scene the pts chest pain had subsided. Pt took 324mg  aspirin at home along with all of her night time medications, including a hydrocodone, before she left to come here. The pt did not originally want to come to ED, but decided to after being informed about checking her cardiac enzyme levels. Pt is a heavy smoker and has COPD w/ no home O2. Pt placed in position of comfort with bed locked and lowered, call bell in reach.

## 2018-07-31 NOTE — ED Notes (Signed)
Pt put on 2L O2 with [place].

## 2018-07-31 NOTE — ED Notes (Signed)
Pt seen walking out room. Pt stated she is leaving. Pt asked to wait until discharge paperwork. Pt stated "provider told her she can leave." Dr. Roderic Palau notified. Pt ambulatory with steady gait. Alert and oriented x4. Denies any pain.

## 2018-08-19 ENCOUNTER — Inpatient Hospital Stay: Payer: Managed Care, Other (non HMO)

## 2018-08-19 ENCOUNTER — Inpatient Hospital Stay: Payer: Managed Care, Other (non HMO) | Attending: Hematology and Oncology | Admitting: Hematology and Oncology

## 2018-08-28 DIAGNOSIS — Z6831 Body mass index (BMI) 31.0-31.9, adult: Secondary | ICD-10-CM | POA: Diagnosis not present

## 2018-08-28 DIAGNOSIS — M545 Low back pain: Secondary | ICD-10-CM | POA: Diagnosis not present

## 2018-08-28 DIAGNOSIS — I10 Essential (primary) hypertension: Secondary | ICD-10-CM | POA: Diagnosis not present

## 2018-08-28 DIAGNOSIS — F17200 Nicotine dependence, unspecified, uncomplicated: Secondary | ICD-10-CM | POA: Diagnosis not present

## 2018-08-28 DIAGNOSIS — F419 Anxiety disorder, unspecified: Secondary | ICD-10-CM | POA: Diagnosis not present

## 2018-09-03 DIAGNOSIS — G8929 Other chronic pain: Secondary | ICD-10-CM | POA: Diagnosis not present

## 2018-09-03 DIAGNOSIS — F419 Anxiety disorder, unspecified: Secondary | ICD-10-CM | POA: Diagnosis not present

## 2018-09-03 DIAGNOSIS — I10 Essential (primary) hypertension: Secondary | ICD-10-CM | POA: Diagnosis not present

## 2018-09-03 DIAGNOSIS — J449 Chronic obstructive pulmonary disease, unspecified: Secondary | ICD-10-CM | POA: Diagnosis not present

## 2018-09-03 DIAGNOSIS — R7301 Impaired fasting glucose: Secondary | ICD-10-CM | POA: Diagnosis not present

## 2018-09-03 DIAGNOSIS — E782 Mixed hyperlipidemia: Secondary | ICD-10-CM | POA: Diagnosis not present

## 2018-09-12 DIAGNOSIS — I10 Essential (primary) hypertension: Secondary | ICD-10-CM | POA: Diagnosis not present

## 2018-09-12 DIAGNOSIS — J449 Chronic obstructive pulmonary disease, unspecified: Secondary | ICD-10-CM | POA: Diagnosis not present

## 2018-09-12 DIAGNOSIS — R7301 Impaired fasting glucose: Secondary | ICD-10-CM | POA: Diagnosis not present

## 2018-09-12 DIAGNOSIS — F419 Anxiety disorder, unspecified: Secondary | ICD-10-CM | POA: Diagnosis not present

## 2018-09-12 DIAGNOSIS — E782 Mixed hyperlipidemia: Secondary | ICD-10-CM | POA: Diagnosis not present

## 2018-09-21 ENCOUNTER — Other Ambulatory Visit: Payer: Self-pay | Admitting: Nurse Practitioner

## 2018-10-29 ENCOUNTER — Telehealth: Payer: Self-pay | Admitting: Hematology and Oncology

## 2018-10-29 IMAGING — DX DG CHEST 2V
2 series · 2 of 2 positions shown · non-contrast
Comparison: Radiographs 12/08/2016

CLINICAL DATA: Central chest pain and shortness of breath.

EXAM:
CHEST  2 VIEW

[chest pa]
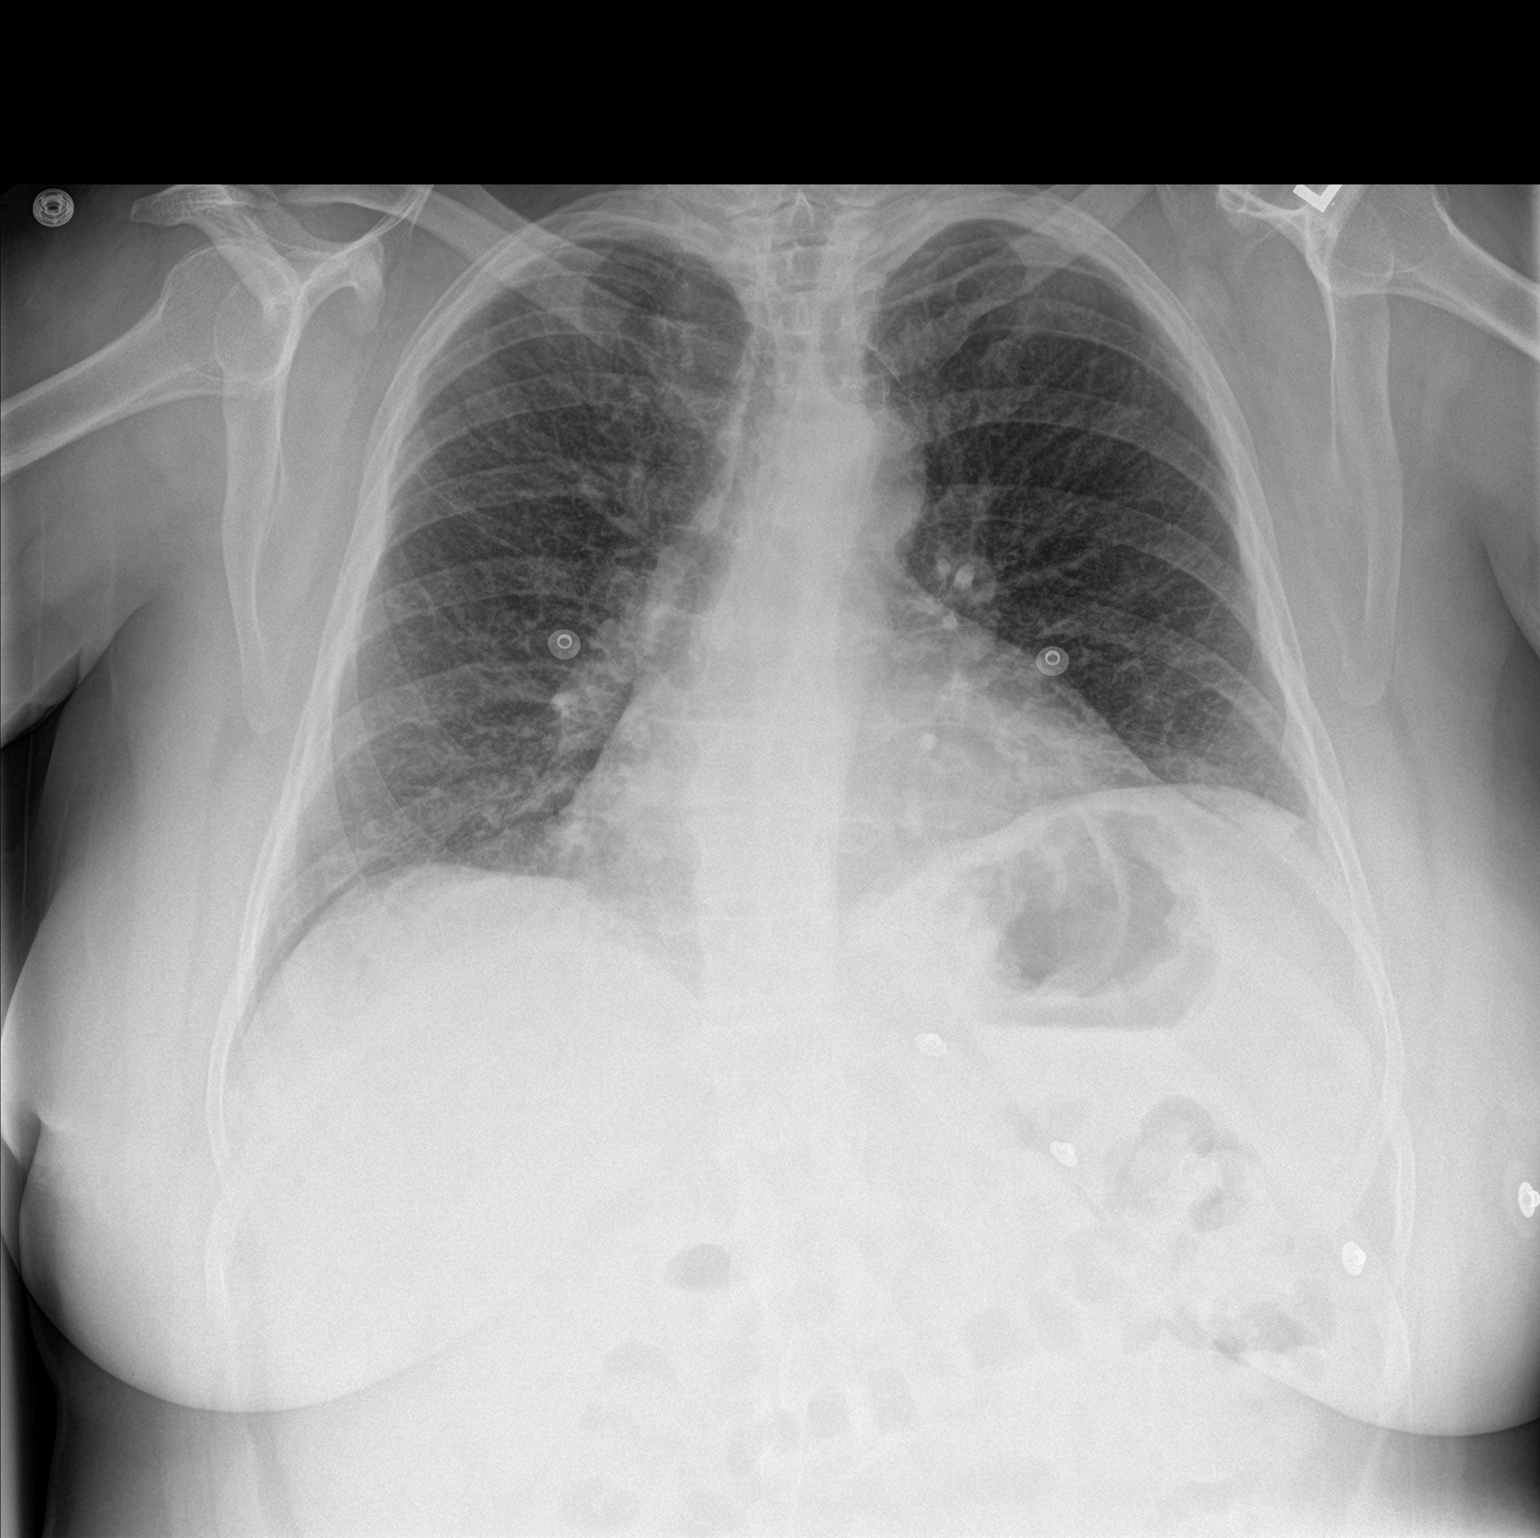

[chest lat]
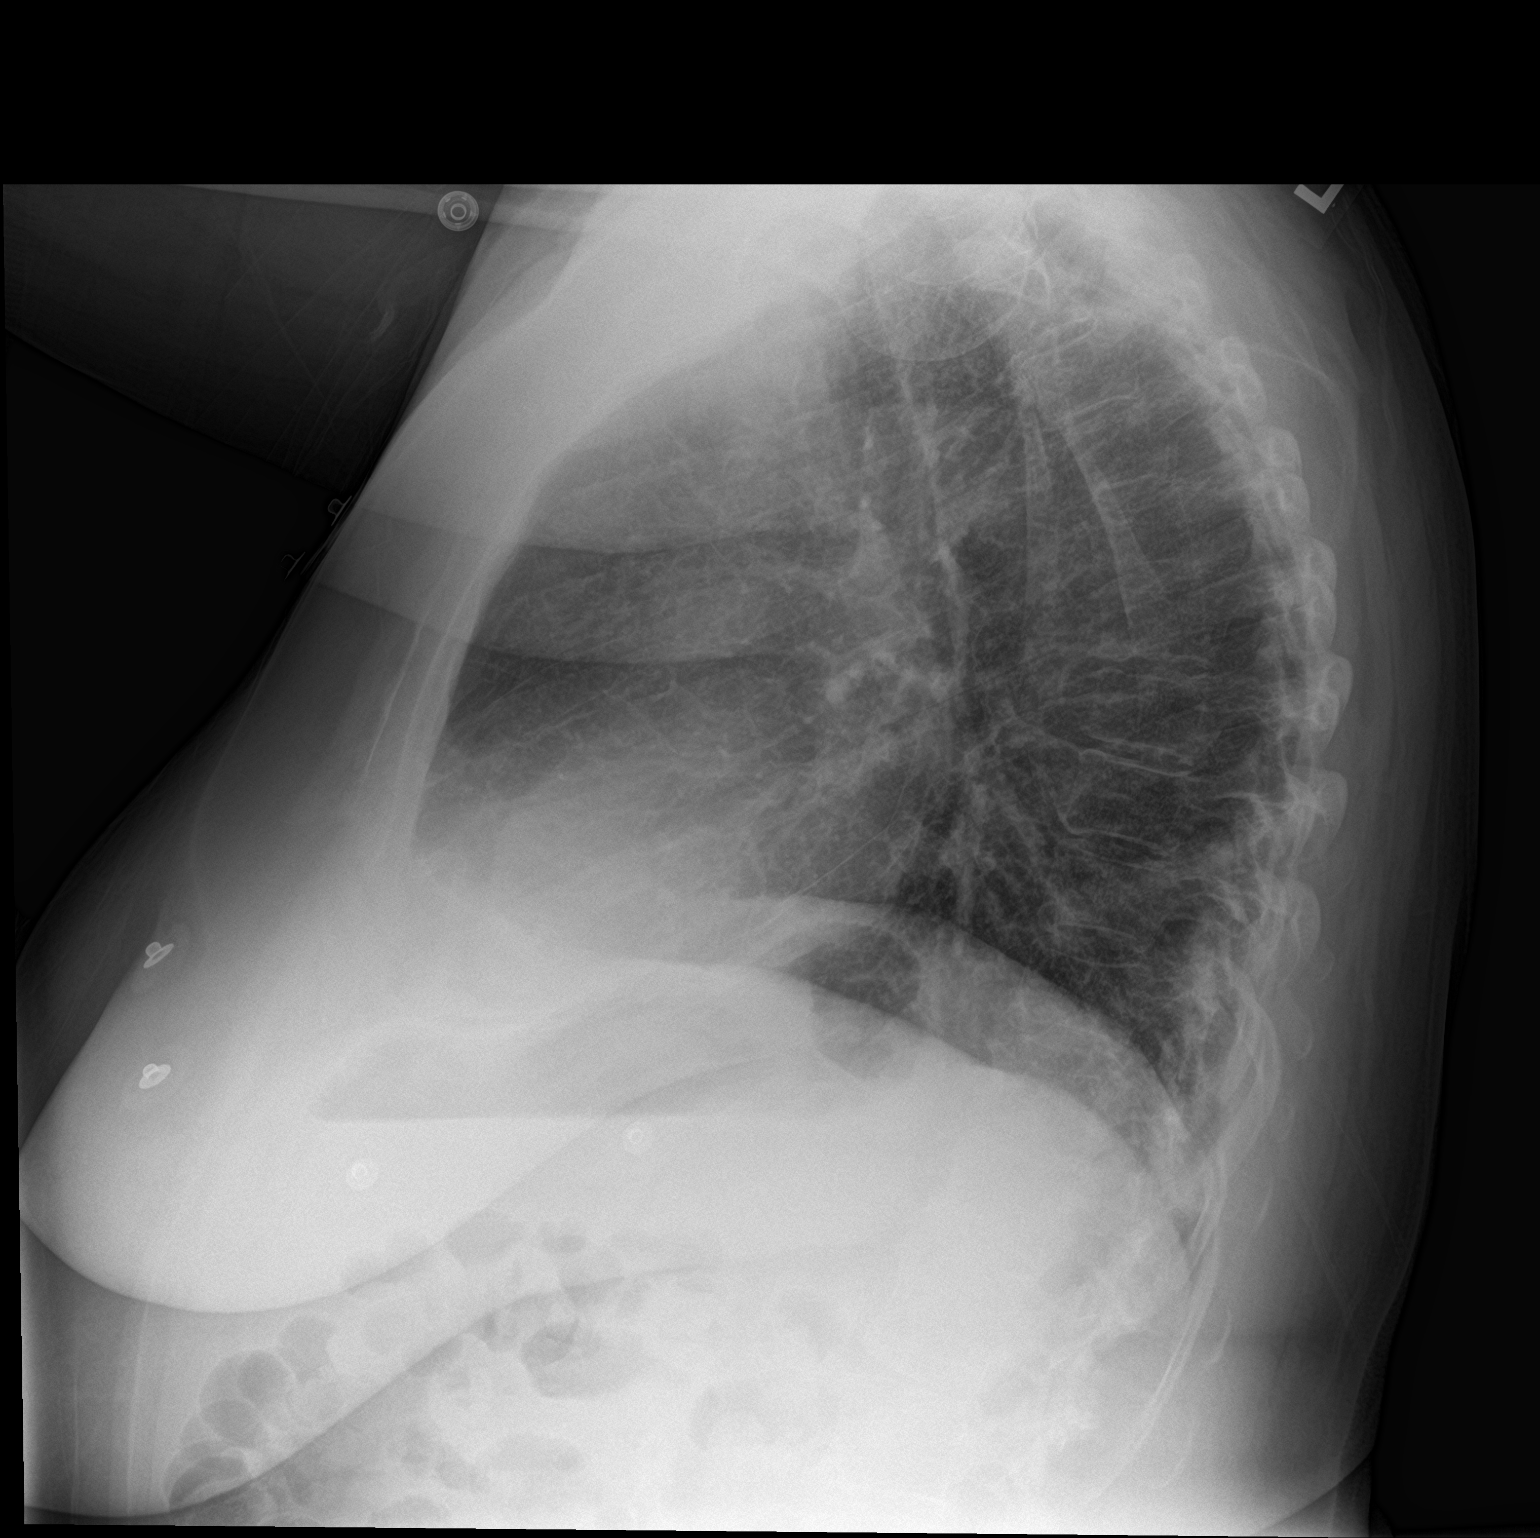

[2 of 2 positions shown; findings below may reference images not displayed]

FINDINGS: Lower lung volumes from prior exam. There is diffuse bronchial
thickening. Streaky left basilar atelectasis. No confluent airspace
disease. Unchanged heart size and mediastinal contours allowing for
lower lung volumes. No pleural fluid or pneumothorax. No acute
osseous abnormalities are seen.
IMPRESSION: Bronchial thickening which appears chronic. Streaky left basilar
atelectasis.

## 2018-10-29 NOTE — Telephone Encounter (Signed)
Patient called to schedule appointment missed

## 2018-11-26 NOTE — Assessment & Plan Note (Signed)
Left mastectomy 03/30/2017: IDC 3.2 cm, DCIS, margins negative, 3/18 lymph nodes positive with focal extranodal extension, grade 2, ER 20%, PR 0%, HER-2 negative ratio 1.13, Ki-67 15% stage IIB  Right mastectomy: Complex sclerosing lesion with UDH, PASH, 0/5 LN Neg   Mammaprint Result: High risk: Average 58 year old risk of recurrence untreated is a 29% without systemic chemotherapy Basal type  Adjuvant chemotherapywas recommended butpatient did not want to undergo chemotherapy because she did not want to feel sick or lose her hair.  Treatment plan:Anastrozole 1 mg dailystarted 06/11/2017 Scans: No evidence of cancer

## 2018-11-27 ENCOUNTER — Inpatient Hospital Stay: Payer: Medicare HMO | Attending: Hematology and Oncology

## 2018-11-27 ENCOUNTER — Inpatient Hospital Stay: Payer: Medicare HMO | Admitting: Hematology and Oncology

## 2018-11-27 DIAGNOSIS — Z79811 Long term (current) use of aromatase inhibitors: Secondary | ICD-10-CM | POA: Insufficient documentation

## 2018-11-27 DIAGNOSIS — C50412 Malignant neoplasm of upper-outer quadrant of left female breast: Secondary | ICD-10-CM | POA: Insufficient documentation

## 2018-11-27 DIAGNOSIS — Z170 Estrogen receptor positive status [ER+]: Secondary | ICD-10-CM | POA: Insufficient documentation

## 2018-12-02 DIAGNOSIS — R7301 Impaired fasting glucose: Secondary | ICD-10-CM | POA: Diagnosis not present

## 2018-12-02 DIAGNOSIS — I10 Essential (primary) hypertension: Secondary | ICD-10-CM | POA: Diagnosis not present

## 2018-12-02 DIAGNOSIS — E782 Mixed hyperlipidemia: Secondary | ICD-10-CM | POA: Diagnosis not present

## 2018-12-02 DIAGNOSIS — E785 Hyperlipidemia, unspecified: Secondary | ICD-10-CM | POA: Diagnosis not present

## 2018-12-03 ENCOUNTER — Telehealth: Payer: Self-pay | Admitting: Hematology and Oncology

## 2018-12-03 NOTE — Telephone Encounter (Signed)
Scheduled appt per 12/23 sch message - pt is aware of appt date and time   

## 2018-12-06 ENCOUNTER — Inpatient Hospital Stay (HOSPITAL_BASED_OUTPATIENT_CLINIC_OR_DEPARTMENT_OTHER): Payer: Medicare HMO | Admitting: Hematology and Oncology

## 2018-12-06 DIAGNOSIS — Z79811 Long term (current) use of aromatase inhibitors: Secondary | ICD-10-CM

## 2018-12-06 DIAGNOSIS — G3281 Cerebellar ataxia in diseases classified elsewhere: Secondary | ICD-10-CM

## 2018-12-06 DIAGNOSIS — C50412 Malignant neoplasm of upper-outer quadrant of left female breast: Secondary | ICD-10-CM | POA: Diagnosis not present

## 2018-12-06 DIAGNOSIS — Z170 Estrogen receptor positive status [ER+]: Secondary | ICD-10-CM

## 2018-12-06 MED ORDER — ANASTROZOLE 1 MG PO TABS: 1.0000 mg | ORAL_TABLET | Freq: Every day | ORAL | 3 refills | Status: DC

## 2018-12-06 MED ORDER — ATORVASTATIN CALCIUM 20 MG PO TABS: 20.0000 mg | ORAL_TABLET | Freq: Every day | ORAL | Status: DC

## 2018-12-06 NOTE — Assessment & Plan Note (Signed)
Left mastectomy 03/30/2017: IDC 3.2 cm, DCIS, margins negative, 3/18 lymph nodes positive with focal extranodal extension, grade 2, ER 20%, PR 0%, HER-2 negative ratio 1.13, Ki-67 15% stage IIB  Right mastectomy: Complex sclerosing lesion with UDH, PASH, 0/5 LN Neg   Mammaprint Result: High risk: Average 58 year old risk of recurrence untreated is a 29% without systemic chemotherapy Basal type  Adjuvant chemotherapywas recommended butpatient did not want to undergo chemotherapy because she did not want to feel sick or lose her hair.  Treatment plan:Anastrozole 1 mg dailystarted 06/11/2017 Anastrozole toxicities: Scans: No evidence of cancer

## 2018-12-06 NOTE — Progress Notes (Signed)
Patient Care Team: Celene Squibb, MD as PCP - General (Internal Medicine)  DIAGNOSIS:  Encounter Diagnosis  Name Primary?  . Malignant neoplasm of upper-outer quadrant of left breast in female, estrogen receptor positive ([place])     SUMMARY OF ONCOLOGIC HISTORY:   Breast cancer of upper-outer quadrant of left female breast ([place])   01/02/2017 Mammogram    Diagnostic mammogram and US  IMPRESSION: 1. Findings highly suspicious for malignancy in the upper outer quadrant of the left breast, measuring 1.8 x 2.1 x 1.6 cm. Medially adjacent to this mass is a 6 mm oval nearly anechoic nodule which could be a cyst or tumor nodule   2. A suspicious left axillary lymph node. 3. Probable fibroadenoma upper-outer quadrant right breast at 10 o'clock. 4. Probably benign calcifications in the right breast.    01/30/2017 Initial Biopsy    Breast, left, needle core biopsy, upper outer 2:00 9 cm fn - INVASIVE DUCTAL CARCINOMA, SEE COMMENT. - DUCTAL CARCINOMA IN SITU. Microscopic Comment The carcinoma appears grade 2.    01/30/2017 Receptors her2    ER 20%+, weak staining, PR-, HER2-, Ki67 15%    03/30/2017 Surgery    Left mastectomy:  IDC 3.2 cm, DCIS, margins negative, 3/18 lymph nodes positive with focal extranodal extension, grade 2, ER 20%, PR 0%, HER-2 negative ratio 1.13, Ki-67 15% stage IIB Right mastectomy: Complex sclerosing lesion with UDH, PASH, 0/5 LN Neg     04/24/2017 Pathology Results    Mammaprint testing: High risk, 10 year risk of recurrence untreated 29%; 5 year distant metastases free interval 94.6% with came on hormonal therapy, basal type     Chemotherapy    Refused chemo    06/11/2017 -  Anti-estrogen oral therapy    Anastrozole 1 mg daily     CHIEF COMPLIANT: Follow-up on anastrozole therapy  INTERVAL HISTORY: Margaret James is a 57 year old with above-mentioned history of high risk left breast cancer treated with the bilateral mastectomies and is currently on  anastrozole therapy.  She did not wish to go on chemotherapy.  She is tolerating anastrozole fairly well.  But she has many other medical issues.  The main complaint today is the dizziness and lightheadedness and the feeling of ataxia.  This is been going on for the past 1 month.  REVIEW OF SYSTEMS:   Constitutional: Denies fevers, chills or abnormal weight loss Eyes: Denies blurriness of vision Ears, nose, mouth, throat, and face: Denies mucositis or sore throat Respiratory: Chronic shortness of breath due to COPD Cardiovascular: Denies palpitation, chest discomfort Gastrointestinal:  Denies nausea, heartburn or change in bowel habits Skin: Denies abnormal skin rashes Lymphatics: Denies new lymphadenopathy or easy bruising Neurological: Dizziness lightheadedness Behavioral/Psych: Mood is stable, no new changes  Extremities: No lower extremity edema Breast: Bilateral mastectomies All other systems were reviewed with the patient and are negative.  I have reviewed the past medical history, past surgical history, social history and family history with the patient and they are unchanged from previous note.  ALLERGIES:  is allergic to lyrica [pregabalin].  MEDICATIONS:  Current Outpatient Medications  Medication Sig Dispense Refill  . albuterol (PROVENTIL HFA;VENTOLIN HFA) 108 (90 Base) MCG/ACT inhaler Inhale 2 puffs into the lungs every 6 (six) hours as needed for wheezing or shortness of breath.    . ALPRAZolam (XANAX) 0.5 MG tablet Take 0.5 mg by mouth daily as needed for anxiety.    Marland Kitchen amLODipine (NORVASC) 5 MG tablet Take 1 tablet (5 mg total) by  mouth daily. 30 tablet 0  . anastrozole (ARIMIDEX) 1 MG tablet Take 1 tablet (1 mg total) by mouth daily. 90 tablet 0  . buPROPion (WELLBUTRIN SR) 200 MG 12 hr tablet Take 200 mg by mouth daily.    . cloNIDine (CATAPRES) 0.1 MG tablet     . DULoxetine (CYMBALTA) 30 MG capsule Take 30 mg by mouth daily.    . Ginkgo Biloba (GINKOBA PO) Take 2  tablets by mouth daily.    Marland Kitchen oxyCODONE-acetaminophen (PERCOCET) 7.5-325 MG tablet     . simvastatin (ZOCOR) 40 MG tablet Take 40 mg by mouth at bedtime.    . Tiotropium Bromide-Olodaterol (STIOLTO RESPIMAT) 2.5-2.5 MCG/ACT AERS Inhale 2 puffs into the lungs daily.    Marland Kitchen tiZANidine (ZANAFLEX) 4 MG tablet Take 4 mg by mouth every 8 (eight) hours as needed for muscle spasms.      No current facility-administered medications for this visit.     PHYSICAL EXAMINATION: ECOG PERFORMANCE STATUS: 1 - Symptomatic but completely ambulatory  Vitals:   12/06/18 1142  BP: 127/84  Pulse: 68  Resp: 18  Temp: 97.9 F (36.6 C)  SpO2: 93%   Filed Weights   12/06/18 1142  Weight: 187 lb 4.8 oz (85 kg)    GENERAL:alert, no distress and comfortable SKIN: skin color, texture, turgor are normal, no rashes or significant lesions EYES: normal, Conjunctiva are pink and non-injected, sclera clear OROPHARYNX:no exudate, no erythema and lips, buccal mucosa, and tongue normal  NECK: supple, thyroid normal size, non-tender, without nodularity LYMPH:  no palpable lymphadenopathy in the cervical, axillary or inguinal LUNGS: clear to auscultation and percussion with normal breathing effort HEART: regular rate & rhythm and no murmurs and no lower extremity edema ABDOMEN:abdomen soft, non-tender and normal bowel sounds MUSCULOSKELETAL:no cyanosis of digits and no clubbing  NEURO: alert & oriented x 3 with fluent speech, no focal motor/sensory deficits EXTREMITIES: No lower extremity edema LABORATORY DATA:  I have reviewed the data as listed CMP Latest Ref Rng & Units 07/31/2018 09/11/2017 03/30/2017  Glucose 70 - 99 mg/dL 107(H) 100 -  BUN 6 - 20 mg/dL 10 15.5 -  Creatinine 0.44 - 1.00 mg/dL 0.94 1.0 0.86  Sodium 135 - 145 mmol/L 139 139 -  Potassium 3.5 - 5.1 mmol/L 4.0 4.3 -  Chloride 98 - 111 mmol/L 102 - -  CO2 22 - 32 mmol/L 26 26 -  Calcium 8.9 - 10.3 mg/dL 9.6 10.0 -  Total Protein 6.4 - 8.3 g/dL - 8.2  -  Total Bilirubin 0.20 - 1.20 mg/dL - 0.38 -  Alkaline Phos 40 - 150 U/L - 66 -  AST 5 - 34 U/L - 15 -  ALT 0 - 55 U/L - 18 -    Lab Results  Component Value Date   WBC 7.2 07/31/2018   HGB 14.5 07/31/2018   HCT 44.7 07/31/2018   MCV 94.7 07/31/2018   PLT 326 07/31/2018   NEUTROABS 4.1 09/11/2017    ASSESSMENT & PLAN:  Breast cancer of upper-outer quadrant of left female breast ([place]) Left mastectomy 03/30/2017: IDC 3.2 cm, DCIS, margins negative, 3/18 lymph nodes positive with focal extranodal extension, grade 2, ER 20%, PR 0%, HER-2 negative ratio 1.13, Ki-67 15% stage IIB  Right mastectomy: Complex sclerosing lesion with UDH, PASH, 0/5 LN Neg   Mammaprint Result: High risk: Average 58 year old risk of recurrence untreated is a 29% without systemic chemotherapy Basal type  Adjuvant chemotherapywas recommended butpatient did not want to undergo chemotherapy  because she did not want to feel sick or lose her hair.  Treatment plan:Anastrozole 1 mg dailystarted 06/11/2017 Anastrozole toxicities: Scans: No evidence of cancer Dizziness and lightheadedness: We will get an MRI of the brain in the next couple of weeks. I will call her with the results of this test. Surveillance for breast cancer: Bilateral mastectomies no role of imaging. Lesion on the left upper lip area: I encouraged her to see a dermatologist to have it taken out.  Return to clinic in 1 year for follow-up or sooner if the MRI of the brain shows any abnormality. No orders of the defined types were placed in this encounter.  The patient has a good understanding of the overall plan. she agrees with it. she will call with any problems that may develop before the next visit here.   Harriette Ohara, MD 12/06/18

## 2018-12-17 ENCOUNTER — Other Ambulatory Visit: Payer: Managed Care, Other (non HMO)

## 2018-12-17 ENCOUNTER — Ambulatory Visit: Payer: Managed Care, Other (non HMO) | Admitting: Hematology and Oncology

## 2018-12-20 ENCOUNTER — Other Ambulatory Visit: Payer: Self-pay | Admitting: Adult Health Nurse Practitioner

## 2018-12-20 ENCOUNTER — Other Ambulatory Visit (HOSPITAL_COMMUNITY): Payer: Self-pay | Admitting: Adult Health Nurse Practitioner

## 2018-12-20 DIAGNOSIS — F17200 Nicotine dependence, unspecified, uncomplicated: Secondary | ICD-10-CM | POA: Diagnosis not present

## 2018-12-20 DIAGNOSIS — F411 Generalized anxiety disorder: Secondary | ICD-10-CM | POA: Diagnosis not present

## 2018-12-20 DIAGNOSIS — R413 Other amnesia: Secondary | ICD-10-CM | POA: Diagnosis not present

## 2018-12-20 DIAGNOSIS — R9402 Abnormal brain scan: Secondary | ICD-10-CM

## 2018-12-20 DIAGNOSIS — L989 Disorder of the skin and subcutaneous tissue, unspecified: Secondary | ICD-10-CM | POA: Diagnosis not present

## 2018-12-20 DIAGNOSIS — E782 Mixed hyperlipidemia: Secondary | ICD-10-CM | POA: Diagnosis not present

## 2018-12-20 DIAGNOSIS — C50919 Malignant neoplasm of unspecified site of unspecified female breast: Secondary | ICD-10-CM | POA: Diagnosis not present

## 2018-12-20 DIAGNOSIS — N182 Chronic kidney disease, stage 2 (mild): Secondary | ICD-10-CM | POA: Diagnosis not present

## 2018-12-20 DIAGNOSIS — M545 Low back pain: Secondary | ICD-10-CM | POA: Diagnosis not present

## 2018-12-20 DIAGNOSIS — I10 Essential (primary) hypertension: Secondary | ICD-10-CM | POA: Diagnosis not present

## 2018-12-20 DIAGNOSIS — J449 Chronic obstructive pulmonary disease, unspecified: Secondary | ICD-10-CM | POA: Diagnosis not present

## 2018-12-25 ENCOUNTER — Telehealth: Payer: Self-pay | Admitting: *Deleted

## 2018-12-26 ENCOUNTER — Encounter (HOSPITAL_COMMUNITY): Payer: Self-pay

## 2018-12-26 ENCOUNTER — Ambulatory Visit (HOSPITAL_COMMUNITY): Payer: Medicare HMO

## 2019-01-01 ENCOUNTER — Ambulatory Visit (HOSPITAL_COMMUNITY)
Admission: RE | Admit: 2019-01-01 | Discharge: 2019-01-01 | Disposition: A | Discharge status: Home / Self Care | Payer: Medicare HMO | Source: Ambulatory Visit | Attending: Adult Health Nurse Practitioner | Admitting: Adult Health Nurse Practitioner

## 2019-01-01 DIAGNOSIS — R9402 Abnormal brain scan: Secondary | ICD-10-CM | POA: Diagnosis not present

## 2019-01-01 DIAGNOSIS — R413 Other amnesia: Secondary | ICD-10-CM | POA: Diagnosis not present

## 2019-01-01 DIAGNOSIS — R51 Headache: Secondary | ICD-10-CM | POA: Diagnosis not present

## 2019-01-01 DIAGNOSIS — R42 Dizziness and giddiness: Secondary | ICD-10-CM | POA: Diagnosis not present

## 2019-01-01 LAB — POCT I-STAT CREATININE: Creatinine, Ser: 0.8 mg/dL (ref 0.44–1.00)

## 2019-01-01 MED ORDER — GADOBUTROL 1 MMOL/ML IV SOLN
8.0000 mL | Freq: Once | INTRAVENOUS | Status: AC | PRN
  Administered 2019-01-01: 8 mL via INTRAVENOUS

## 2019-01-14 DIAGNOSIS — D225 Melanocytic nevi of trunk: Secondary | ICD-10-CM | POA: Diagnosis not present

## 2019-01-14 DIAGNOSIS — Z08 Encounter for follow-up examination after completed treatment for malignant neoplasm: Secondary | ICD-10-CM | POA: Diagnosis not present

## 2019-01-14 DIAGNOSIS — L570 Actinic keratosis: Secondary | ICD-10-CM | POA: Diagnosis not present

## 2019-01-14 DIAGNOSIS — D2272 Melanocytic nevi of left lower limb, including hip: Secondary | ICD-10-CM | POA: Diagnosis not present

## 2019-01-14 DIAGNOSIS — J449 Chronic obstructive pulmonary disease, unspecified: Secondary | ICD-10-CM | POA: Diagnosis not present

## 2019-01-14 DIAGNOSIS — Z853 Personal history of malignant neoplasm of breast: Secondary | ICD-10-CM | POA: Diagnosis not present

## 2019-01-14 DIAGNOSIS — C4401 Basal cell carcinoma of skin of lip: Secondary | ICD-10-CM | POA: Diagnosis not present

## 2019-01-14 DIAGNOSIS — C44319 Basal cell carcinoma of skin of other parts of face: Secondary | ICD-10-CM | POA: Diagnosis not present

## 2019-01-14 DIAGNOSIS — Z1283 Encounter for screening for malignant neoplasm of skin: Secondary | ICD-10-CM | POA: Diagnosis not present

## 2019-01-14 DIAGNOSIS — F419 Anxiety disorder, unspecified: Secondary | ICD-10-CM | POA: Diagnosis not present

## 2019-01-14 DIAGNOSIS — Z85828 Personal history of other malignant neoplasm of skin: Secondary | ICD-10-CM | POA: Diagnosis not present

## 2019-01-14 DIAGNOSIS — X32XXXA Exposure to sunlight, initial encounter: Secondary | ICD-10-CM | POA: Diagnosis not present

## 2019-01-14 DIAGNOSIS — F17200 Nicotine dependence, unspecified, uncomplicated: Secondary | ICD-10-CM | POA: Diagnosis not present

## 2019-02-19 DIAGNOSIS — L820 Inflamed seborrheic keratosis: Secondary | ICD-10-CM | POA: Diagnosis not present

## 2019-02-20 DIAGNOSIS — M654 Radial styloid tenosynovitis [de Quervain]: Secondary | ICD-10-CM | POA: Diagnosis not present

## 2019-03-19 DIAGNOSIS — J449 Chronic obstructive pulmonary disease, unspecified: Secondary | ICD-10-CM | POA: Diagnosis not present

## 2019-03-19 DIAGNOSIS — J440 Chronic obstructive pulmonary disease with acute lower respiratory infection: Secondary | ICD-10-CM | POA: Diagnosis not present

## 2019-04-18 DIAGNOSIS — J440 Chronic obstructive pulmonary disease with acute lower respiratory infection: Secondary | ICD-10-CM | POA: Diagnosis not present

## 2019-04-18 DIAGNOSIS — J449 Chronic obstructive pulmonary disease, unspecified: Secondary | ICD-10-CM | POA: Diagnosis not present

## 2019-04-24 DIAGNOSIS — Z0000 Encounter for general adult medical examination without abnormal findings: Secondary | ICD-10-CM | POA: Diagnosis not present

## 2019-05-01 DIAGNOSIS — C4401 Basal cell carcinoma of skin of lip: Secondary | ICD-10-CM | POA: Diagnosis not present

## 2019-05-19 DIAGNOSIS — J449 Chronic obstructive pulmonary disease, unspecified: Secondary | ICD-10-CM | POA: Diagnosis not present

## 2019-05-19 DIAGNOSIS — J440 Chronic obstructive pulmonary disease with acute lower respiratory infection: Secondary | ICD-10-CM | POA: Diagnosis not present

## 2019-06-12 ENCOUNTER — Other Ambulatory Visit: Payer: Self-pay | Admitting: Hematology and Oncology

## 2019-06-12 DIAGNOSIS — C50412 Malignant neoplasm of upper-outer quadrant of left female breast: Secondary | ICD-10-CM

## 2019-06-13 DIAGNOSIS — E785 Hyperlipidemia, unspecified: Secondary | ICD-10-CM | POA: Diagnosis not present

## 2019-06-13 DIAGNOSIS — R7301 Impaired fasting glucose: Secondary | ICD-10-CM | POA: Diagnosis not present

## 2019-06-13 DIAGNOSIS — J449 Chronic obstructive pulmonary disease, unspecified: Secondary | ICD-10-CM | POA: Diagnosis not present

## 2019-06-13 DIAGNOSIS — I10 Essential (primary) hypertension: Secondary | ICD-10-CM | POA: Diagnosis not present

## 2019-06-13 DIAGNOSIS — E782 Mixed hyperlipidemia: Secondary | ICD-10-CM | POA: Diagnosis not present

## 2019-06-18 DIAGNOSIS — J440 Chronic obstructive pulmonary disease with acute lower respiratory infection: Secondary | ICD-10-CM | POA: Diagnosis not present

## 2019-06-18 DIAGNOSIS — J449 Chronic obstructive pulmonary disease, unspecified: Secondary | ICD-10-CM | POA: Diagnosis not present

## 2019-06-27 DIAGNOSIS — I10 Essential (primary) hypertension: Secondary | ICD-10-CM | POA: Diagnosis not present

## 2019-06-27 DIAGNOSIS — E785 Hyperlipidemia, unspecified: Secondary | ICD-10-CM | POA: Diagnosis not present

## 2019-06-27 DIAGNOSIS — R7301 Impaired fasting glucose: Secondary | ICD-10-CM | POA: Diagnosis not present

## 2019-06-27 DIAGNOSIS — E782 Mixed hyperlipidemia: Secondary | ICD-10-CM | POA: Diagnosis not present

## 2019-06-27 DIAGNOSIS — F411 Generalized anxiety disorder: Secondary | ICD-10-CM | POA: Diagnosis not present

## 2019-07-04 DIAGNOSIS — Z79891 Long term (current) use of opiate analgesic: Secondary | ICD-10-CM | POA: Diagnosis not present

## 2019-07-04 DIAGNOSIS — F411 Generalized anxiety disorder: Secondary | ICD-10-CM | POA: Diagnosis not present

## 2019-07-04 DIAGNOSIS — E782 Mixed hyperlipidemia: Secondary | ICD-10-CM | POA: Diagnosis not present

## 2019-07-04 DIAGNOSIS — G894 Chronic pain syndrome: Secondary | ICD-10-CM | POA: Diagnosis not present

## 2019-07-04 DIAGNOSIS — J449 Chronic obstructive pulmonary disease, unspecified: Secondary | ICD-10-CM | POA: Diagnosis not present

## 2019-07-04 DIAGNOSIS — I129 Hypertensive chronic kidney disease with stage 1 through stage 4 chronic kidney disease, or unspecified chronic kidney disease: Secondary | ICD-10-CM | POA: Diagnosis not present

## 2019-07-04 DIAGNOSIS — E875 Hyperkalemia: Secondary | ICD-10-CM | POA: Diagnosis not present

## 2019-07-04 DIAGNOSIS — N183 Chronic kidney disease, stage 3 (moderate): Secondary | ICD-10-CM | POA: Diagnosis not present

## 2019-07-04 DIAGNOSIS — F17200 Nicotine dependence, unspecified, uncomplicated: Secondary | ICD-10-CM | POA: Diagnosis not present

## 2019-07-19 DIAGNOSIS — J440 Chronic obstructive pulmonary disease with acute lower respiratory infection: Secondary | ICD-10-CM | POA: Diagnosis not present

## 2019-07-19 DIAGNOSIS — J449 Chronic obstructive pulmonary disease, unspecified: Secondary | ICD-10-CM | POA: Diagnosis not present

## 2019-07-29 DIAGNOSIS — E782 Mixed hyperlipidemia: Secondary | ICD-10-CM | POA: Diagnosis not present

## 2019-07-29 DIAGNOSIS — I10 Essential (primary) hypertension: Secondary | ICD-10-CM | POA: Diagnosis not present

## 2019-07-29 DIAGNOSIS — R7301 Impaired fasting glucose: Secondary | ICD-10-CM | POA: Diagnosis not present

## 2019-07-29 DIAGNOSIS — J449 Chronic obstructive pulmonary disease, unspecified: Secondary | ICD-10-CM | POA: Diagnosis not present

## 2019-07-29 DIAGNOSIS — E785 Hyperlipidemia, unspecified: Secondary | ICD-10-CM | POA: Diagnosis not present

## 2019-08-19 DIAGNOSIS — J440 Chronic obstructive pulmonary disease with acute lower respiratory infection: Secondary | ICD-10-CM | POA: Diagnosis not present

## 2019-08-19 DIAGNOSIS — J449 Chronic obstructive pulmonary disease, unspecified: Secondary | ICD-10-CM | POA: Diagnosis not present

## 2019-09-18 DIAGNOSIS — J440 Chronic obstructive pulmonary disease with acute lower respiratory infection: Secondary | ICD-10-CM | POA: Diagnosis not present

## 2019-09-18 DIAGNOSIS — J449 Chronic obstructive pulmonary disease, unspecified: Secondary | ICD-10-CM | POA: Diagnosis not present

## 2019-09-30 DIAGNOSIS — I10 Essential (primary) hypertension: Secondary | ICD-10-CM | POA: Diagnosis not present

## 2019-09-30 DIAGNOSIS — Z79891 Long term (current) use of opiate analgesic: Secondary | ICD-10-CM | POA: Diagnosis not present

## 2019-09-30 DIAGNOSIS — Z136 Encounter for screening for cardiovascular disorders: Secondary | ICD-10-CM | POA: Diagnosis not present

## 2019-09-30 DIAGNOSIS — G894 Chronic pain syndrome: Secondary | ICD-10-CM | POA: Diagnosis not present

## 2019-09-30 DIAGNOSIS — R079 Chest pain, unspecified: Secondary | ICD-10-CM | POA: Diagnosis not present

## 2019-10-19 DIAGNOSIS — J440 Chronic obstructive pulmonary disease with acute lower respiratory infection: Secondary | ICD-10-CM | POA: Diagnosis not present

## 2019-10-19 DIAGNOSIS — J449 Chronic obstructive pulmonary disease, unspecified: Secondary | ICD-10-CM | POA: Diagnosis not present

## 2019-11-10 ENCOUNTER — Other Ambulatory Visit: Payer: Self-pay

## 2019-11-10 ENCOUNTER — Ambulatory Visit: Payer: Medicare HMO | Admitting: Cardiology

## 2019-11-10 ENCOUNTER — Encounter: Payer: Self-pay | Admitting: Cardiology

## 2019-11-10 VITALS — BP 142/83 | HR 89 | Temp 95.5°F | Ht 65.5 in | Wt 181.0 lb

## 2019-11-10 DIAGNOSIS — I10 Essential (primary) hypertension: Secondary | ICD-10-CM

## 2019-11-10 DIAGNOSIS — Z87898 Personal history of other specified conditions: Secondary | ICD-10-CM | POA: Diagnosis not present

## 2019-11-10 DIAGNOSIS — R0602 Shortness of breath: Secondary | ICD-10-CM

## 2019-11-10 DIAGNOSIS — Z720 Tobacco use: Secondary | ICD-10-CM

## 2019-11-10 DIAGNOSIS — E782 Mixed hyperlipidemia: Secondary | ICD-10-CM | POA: Diagnosis not present

## 2019-11-10 DIAGNOSIS — J449 Chronic obstructive pulmonary disease, unspecified: Secondary | ICD-10-CM | POA: Diagnosis not present

## 2019-11-10 NOTE — Patient Instructions (Signed)
Medication Instructions:  Your physician recommends that you continue on your current medications as directed. Please refer to the Current Medication list given to you today.   Labwork: NONE  Testing/Procedures: Your physician has requested that you have an echocardiogram. Echocardiography is a painless test that uses sound waves to create images of your heart. It provides your doctor with information about the size and shape of your heart and how well your heart's chambers and valves are working. This procedure takes approximately one hour. There are no restrictions for this procedure.    Follow-Up: Your physician wants you to follow-up in: 1 YEAR.  You will receive a reminder letter in the mail two months in advance. If you don't receive a letter, please call our office to schedule the follow-up appointment.   Any Other Special Instructions Will Be Listed Below (If Applicable).     If you need a refill on your cardiac medications before your next appointment, please call your pharmacy.   

## 2019-11-10 NOTE — Progress Notes (Signed)
Cardiology Office Note  Date: 11/10/2019   ID: Bisan, Blight Jun 07, 1961, MRN [id number]  PCP:  Pablo Lawrence, NP  Consulting Cardiologist: Satira Sark, MD Electrophysiologist:  None   Chief Complaint  Patient presents with  . Chest Pain    History of Present Illness: Margaret James is a 58 y.o. female referred for cardiology consultation by Dr. Nevada Crane for the evaluation of chest pain.  She has no known history of obstructive CAD or myocardial infarction, but was prescribed nitroglycerin by provider in the past.  She states that she has never used it until a few months ago when she had a single episode of chest discomfort and ultimately took 2 nitroglycerin tablets reporting improvement in symptoms over a few minutes.  She does not report exertional chest pain on any regular basis but does have chronic dyspnea on exertion.  She has chronic pain as well, was using a rolling walker today.  Records indicate prior cardiac assessment by Dr. Martinique in 2018 for assessment of atypical chest pain.  Echocardiogram revealed LVEF 55 to 60% without regional wall motion abnormalities, mild diastolic dysfunction, no major valvular abnormalities.  Lexiscan Myoview was low risk without evidence of ischemia.  Patient's 10-year risk of cardiovascular disease is 11.4%, intermediate overall.  I discussed this with her today and reviewed her modifiable risk factors.  She has a long history of tobacco abuse, I talked with her about the importance of smoking cessation.  She is on Norvasc and clonidine for control of high blood pressure.  She also reports compliance with Lipitor.  Her most recent LDL was 108.  I did review her recent ECG which is outlined below and overall nonspecific.  She has had leftward axis previously, although left anterior fascicular block is technically a new finding at least compared to tracing from a year ago.  No pathologic Q waves.  Past Medical History:   Diagnosis Date  . Anxiety   . Arthritis   . Breast cancer ([place])    Left, ER positive IDC with DCIS   . Chronic pain   . COPD (chronic obstructive pulmonary disease) ([place])   . Depression   . Essential hypertension   . Gout   . Melanoma of back ([place])   . Mixed hyperlipidemia   . Muscle spasm   . Stress incontinence   . Uterine cancer [facility])     Past Surgical History:  Procedure Laterality Date  . BREAST BIOPSY Left 12/2016  . MASTECTOMY COMPLETE / SIMPLE Right 03/30/2017  . MASTECTOMY MODIFIED RADICAL Left 03/30/2017  . MASTECTOMY MODIFIED RADICAL Left 03/30/2017   Procedure: LEFT MASTECTOMY MODIFIED RADICAL;  Surgeon: Fanny Skates, MD;  Location: [place];  Service: General;  Laterality: Left;  Marland Kitchen MELANOMA EXCISION     "back"  . SIMPLE MASTECTOMY WITH AXILLARY SENTINEL NODE BIOPSY Right 03/30/2017   Procedure: RIGHT SIMPLE MASTECTOMY;  Surgeon: Fanny Skates, MD;  Location: [place];  Service: General;  Laterality: Right;  . TONSILLECTOMY  1977  . VAGINAL HYSTERECTOMY      Current Outpatient Medications  Medication Sig Dispense Refill  . albuterol (PROVENTIL HFA;VENTOLIN HFA) 108 (90 Base) MCG/ACT inhaler Inhale 2 puffs into the lungs every 6 (six) hours as needed for wheezing or shortness of breath.    . ALPRAZolam (XANAX) 0.5 MG tablet Take 0.5 mg by mouth daily as needed for anxiety.    Marland Kitchen amLODipine (NORVASC) 5 MG tablet Take 1 tablet (5 mg total) by mouth daily. 30 tablet  0  . anastrozole (ARIMIDEX) 1 MG tablet TAKE 1 TABLET EVERY DAY 90 tablet 3  . atorvastatin (LIPITOR) 20 MG tablet Take 1 tablet (20 mg total) by mouth daily.    Marland Kitchen buPROPion (WELLBUTRIN SR) 200 MG 12 hr tablet Take 200 mg by mouth daily.    . cloNIDine (CATAPRES) 0.1 MG tablet     . DULoxetine (CYMBALTA) 30 MG capsule Take 30 mg by mouth daily.    Marland Kitchen oxyCODONE-acetaminophen (PERCOCET) 10-325 MG tablet     . Tiotropium Bromide-Olodaterol (STIOLTO RESPIMAT) 2.5-2.5 MCG/ACT AERS Inhale 2 puffs into the lungs  daily.    Marland Kitchen tiZANidine (ZANAFLEX) 4 MG tablet Take 4 mg by mouth every 8 (eight) hours as needed for muscle spasms.      No current facility-administered medications for this visit.    Allergies:  Lyrica [pregabalin]   Social History: The patient  reports that she has been smoking cigarettes. She has a 61.50 pack-year smoking history. She has never used smokeless tobacco. She reports current drug use. Drug: Marijuana. She reports that she does not drink alcohol.   Family History: The patient's family history includes COPD in her mother; Heart disease in her father; Lung cancer in her sister.   ROS:  Please see the history of present illness. Otherwise, complete review of systems is positive for chronic back pain, also intermittent dizziness, no palpitations or syncope.  All other systems are reviewed and negative.   Physical Exam: VS:  BP (!) 142/83   Pulse 89   Temp (!) 95.5 F (35.3 C)   Ht 5' 5.5" (1.664 m)   Wt 181 lb (82.1 kg)   SpO2 91%   BMI 29.66 kg/m , BMI Body mass index is 29.66 kg/m.  Wt Readings from Last 3 Encounters:  11/10/19 181 lb (82.1 kg)  12/06/18 187 lb 4.8 oz (85 kg)  04/01/18 189 lb 8 oz (86 kg)    General: Patient appears comfortable at rest.  Using a rolling walker. HEENT: Conjunctiva and lids normal, wearing a mask. Neck: Supple, no elevated JVP or carotid bruits, no thyromegaly. Lungs: Diminished breath sounds without wheezing, nonlabored breathing at rest. Cardiac: Regular rate and rhythm, no S3, soft systolic murmur, no pericardial rub. Abdomen: Soft, nontender, bowel sounds present. Extremities: No pitting edema, distal pulses 2+. Skin: Warm and dry. Musculoskeletal: No kyphosis. Neuropsychiatric: Alert and oriented x3, affect grossly appropriate.  ECG:  An ECG dated 09/30/2019 was personally reviewed today and demonstrated:  Normal sinus rhythm with left anterior fascicular block and nonspecific T wave changes.  Recent Labwork: 01/01/2019:  Creatinine, Ser 0.80     Component Value Date/Time   CHOL 196 03/05/2017 0435   TRIG 243 (H) 03/05/2017 0435   HDL 39 (L) 03/05/2017 0435   CHOLHDL 5.0 03/05/2017 0435   VLDL 49 (H) 03/05/2017 0435   LDLCALC 108 (H) 03/05/2017 0435    Other Studies Reviewed Today:  Echocardiogram 03/05/2017: Study Conclusions  - Left ventricle: The cavity size was normal. Systolic function was   normal. The estimated ejection fraction was in the range of 55%   to 60%. Wall motion was normal; there were no regional wall   motion abnormalities. Doppler parameters are consistent with   abnormal left ventricular relaxation (grade 1 diastolic   dysfunction). Doppler parameters are consistent with   indeterminate ventricular filling pressure. - Aortic valve: Transvalvular velocity was within the normal range.   There was no stenosis. There was no regurgitation. - Mitral valve: Transvalvular  velocity was within the normal range.   There was no evidence for stenosis. There was no regurgitation.   Valve area by pressure half-time: 1.73 cm^2. - Right ventricle: The cavity size was normal. Wall thickness was   normal. Systolic function was normal. - Atrial septum: No defect or patent foramen ovale was identified   by color flow Doppler. - Tricuspid valve: There was no regurgitation.  Lexiscan Myoview 03/05/2017: IMPRESSION: 1. No reversible ischemia or infarction.  2. Normal left ventricular wall motion.  3. Left ventricular ejection fraction 56%  4. Non invasive risk stratification*: Low  Assessment and Plan:  66.  59 year old woman with a longstanding history of tobacco abuse, hypertension, and hyperlipidemia, also heart disease in her father.  Recent ECG reviewed and overall nonspecific without pathologic Q waves.  She reports single episode of chest pain and nitroglycerin use a few months ago, no progressive pattern to symptoms.  She has baseline dyspnea on exertion which has not changed  significantly.  She underwent cardiac structural and ischemic testing 2 years ago, overall reassuring.  Today I talked with her about risk factor modification strategies, importance of smoking cessation, also continuing medical therapy for treatment of hypertension and hyperlipidemia.  We will obtain a follow-up echocardiogram to ensure stability in LVEF, but do not anticipate further ischemic testing at this time.  2.  Tobacco abuse.  Importance of smoking cessation discussed.  3.  Essential hypertension, on Norvasc and clonidine.  Systolic is in the 0000000 today.  Weight loss and sodium restriction also important.  Keep follow-up with PCP.  4.  Mixed hyperlipidemia, she is on Lipitor 20 mg daily.  Last LDL 108 in 2018, she has potentially had follow-up lab work with PCP.  Would consider increasing Lipitor to 40 mg daily unless her LDL is under 70 already.  Medication Adjustments/Labs and Tests Ordered: Current medicines are reviewed at length with the patient today.  Concerns regarding medicines are outlined above.   Tests Ordered: Orders Placed This Encounter  Procedures  . ECHOCARDIOGRAM COMPLETE    Medication Changes: No orders of the defined types were placed in this encounter.   Disposition:  Follow up 1 year for routine assessment.  Signed, Satira Sark, MD, [facility] 11/10/2019 3:09 PM    [facility] [address] [place], [place] [address] Phone: [phone]; Fax: [phone]

## 2019-11-12 ENCOUNTER — Ambulatory Visit (HOSPITAL_COMMUNITY): Admission: RE | Admit: 2019-11-12 | Payer: Medicare HMO | Source: Ambulatory Visit

## 2019-11-17 DIAGNOSIS — C50911 Malignant neoplasm of unspecified site of right female breast: Secondary | ICD-10-CM | POA: Diagnosis not present

## 2019-11-17 DIAGNOSIS — C50912 Malignant neoplasm of unspecified site of left female breast: Secondary | ICD-10-CM | POA: Diagnosis not present

## 2019-11-18 DIAGNOSIS — J440 Chronic obstructive pulmonary disease with acute lower respiratory infection: Secondary | ICD-10-CM | POA: Diagnosis not present

## 2019-11-18 DIAGNOSIS — J449 Chronic obstructive pulmonary disease, unspecified: Secondary | ICD-10-CM | POA: Diagnosis not present

## 2019-11-24 ENCOUNTER — Ambulatory Visit (HOSPITAL_COMMUNITY): Payer: Medicare HMO

## 2019-11-26 DIAGNOSIS — C50911 Malignant neoplasm of unspecified site of right female breast: Secondary | ICD-10-CM | POA: Diagnosis not present

## 2019-11-26 DIAGNOSIS — C50912 Malignant neoplasm of unspecified site of left female breast: Secondary | ICD-10-CM | POA: Diagnosis not present

## 2019-11-28 ENCOUNTER — Ambulatory Visit (HOSPITAL_COMMUNITY): Payer: Medicare HMO | Attending: Cardiology

## 2019-12-04 ENCOUNTER — Telehealth: Payer: Self-pay | Admitting: Hematology and Oncology

## 2019-12-04 NOTE — Telephone Encounter (Signed)
R/s appt per 12/24 sch message - pt is aware of new appt date and time

## 2019-12-08 ENCOUNTER — Inpatient Hospital Stay: Payer: Medicare HMO | Admitting: Hematology and Oncology

## 2019-12-09 ENCOUNTER — Ambulatory Visit (HOSPITAL_COMMUNITY): Payer: Medicare HMO

## 2019-12-09 ENCOUNTER — Telehealth: Payer: Self-pay | Admitting: Cardiology

## 2019-12-09 NOTE — Telephone Encounter (Signed)
  Precert needed for: Echo    Location:   [facility]  Date: Dec 15, 2018

## 2019-12-16 ENCOUNTER — Ambulatory Visit (HOSPITAL_COMMUNITY)
Admission: RE | Admit: 2019-12-16 | Discharge: 2019-12-16 | Disposition: A | Discharge status: Home / Self Care | Payer: Medicare Other | Source: Ambulatory Visit | Attending: Cardiology | Admitting: Cardiology

## 2019-12-16 ENCOUNTER — Other Ambulatory Visit: Payer: Self-pay

## 2019-12-16 DIAGNOSIS — R0602 Shortness of breath: Secondary | ICD-10-CM

## 2019-12-16 NOTE — Progress Notes (Signed)
*  PRELIMINARY RESULTS* Echocardiogram 2D Echocardiogram has been performed.  Margaret James 12/16/2019, 4:04 PM

## 2019-12-29 ENCOUNTER — Telehealth: Payer: Self-pay | Admitting: Hematology and Oncology

## 2019-12-29 NOTE — Telephone Encounter (Signed)
Returned patient's phone call regarding cancelling 01/19 appointment, per patient' request appointment has been cancelled.

## 2019-12-30 ENCOUNTER — Inpatient Hospital Stay: Payer: Medicare HMO | Admitting: Hematology and Oncology

## 2020-01-19 DIAGNOSIS — J440 Chronic obstructive pulmonary disease with acute lower respiratory infection: Secondary | ICD-10-CM | POA: Diagnosis not present

## 2020-01-30 DIAGNOSIS — R7301 Impaired fasting glucose: Secondary | ICD-10-CM | POA: Diagnosis not present

## 2020-01-30 DIAGNOSIS — I10 Essential (primary) hypertension: Secondary | ICD-10-CM | POA: Diagnosis not present

## 2020-01-30 DIAGNOSIS — J449 Chronic obstructive pulmonary disease, unspecified: Secondary | ICD-10-CM | POA: Diagnosis not present

## 2020-01-30 DIAGNOSIS — E785 Hyperlipidemia, unspecified: Secondary | ICD-10-CM | POA: Diagnosis not present

## 2020-02-16 DIAGNOSIS — J440 Chronic obstructive pulmonary disease with acute lower respiratory infection: Secondary | ICD-10-CM | POA: Diagnosis not present

## 2020-03-03 DIAGNOSIS — H8110 Benign paroxysmal vertigo, unspecified ear: Secondary | ICD-10-CM | POA: Diagnosis not present

## 2020-03-06 DIAGNOSIS — J449 Chronic obstructive pulmonary disease, unspecified: Secondary | ICD-10-CM | POA: Diagnosis not present

## 2020-03-06 DIAGNOSIS — Z79891 Long term (current) use of opiate analgesic: Secondary | ICD-10-CM | POA: Diagnosis not present

## 2020-03-06 DIAGNOSIS — I10 Essential (primary) hypertension: Secondary | ICD-10-CM | POA: Diagnosis not present

## 2020-03-06 DIAGNOSIS — Z9981 Dependence on supplemental oxygen: Secondary | ICD-10-CM | POA: Diagnosis not present

## 2020-03-09 DIAGNOSIS — H8110 Benign paroxysmal vertigo, unspecified ear: Secondary | ICD-10-CM | POA: Diagnosis not present

## 2020-03-09 DIAGNOSIS — I10 Essential (primary) hypertension: Secondary | ICD-10-CM | POA: Diagnosis not present

## 2020-03-10 ENCOUNTER — Other Ambulatory Visit: Payer: Self-pay | Admitting: Internal Medicine

## 2020-03-10 ENCOUNTER — Other Ambulatory Visit (HOSPITAL_COMMUNITY): Payer: Self-pay | Admitting: Internal Medicine

## 2020-03-10 DIAGNOSIS — G459 Transient cerebral ischemic attack, unspecified: Secondary | ICD-10-CM

## 2020-03-17 ENCOUNTER — Ambulatory Visit (HOSPITAL_COMMUNITY): Payer: Medicare Other

## 2020-03-18 DIAGNOSIS — J440 Chronic obstructive pulmonary disease with acute lower respiratory infection: Secondary | ICD-10-CM | POA: Diagnosis not present

## 2020-04-02 ENCOUNTER — Ambulatory Visit (HOSPITAL_COMMUNITY): Payer: Medicare Other

## 2020-04-17 DIAGNOSIS — J440 Chronic obstructive pulmonary disease with acute lower respiratory infection: Secondary | ICD-10-CM | POA: Diagnosis not present

## 2020-04-20 ENCOUNTER — Other Ambulatory Visit (HOSPITAL_COMMUNITY): Payer: Medicare Other

## 2020-04-23 ENCOUNTER — Other Ambulatory Visit: Payer: Self-pay

## 2020-04-23 ENCOUNTER — Ambulatory Visit (HOSPITAL_COMMUNITY)
Admission: RE | Admit: 2020-04-23 | Discharge: 2020-04-23 | Disposition: A | Discharge status: Home / Self Care | Payer: Medicare Other | Source: Ambulatory Visit | Attending: Internal Medicine | Admitting: Internal Medicine

## 2020-04-23 DIAGNOSIS — G459 Transient cerebral ischemic attack, unspecified: Secondary | ICD-10-CM | POA: Diagnosis not present

## 2020-04-23 DIAGNOSIS — I10 Essential (primary) hypertension: Secondary | ICD-10-CM | POA: Diagnosis not present

## 2020-04-23 DIAGNOSIS — E875 Hyperkalemia: Secondary | ICD-10-CM | POA: Diagnosis not present

## 2020-04-23 DIAGNOSIS — J449 Chronic obstructive pulmonary disease, unspecified: Secondary | ICD-10-CM | POA: Diagnosis not present

## 2020-04-23 DIAGNOSIS — E785 Hyperlipidemia, unspecified: Secondary | ICD-10-CM | POA: Diagnosis not present

## 2020-04-23 DIAGNOSIS — E782 Mixed hyperlipidemia: Secondary | ICD-10-CM | POA: Diagnosis not present

## 2020-04-23 DIAGNOSIS — C50919 Malignant neoplasm of unspecified site of unspecified female breast: Secondary | ICD-10-CM | POA: Diagnosis not present

## 2020-04-23 DIAGNOSIS — R7301 Impaired fasting glucose: Secondary | ICD-10-CM | POA: Diagnosis not present

## 2020-04-23 DIAGNOSIS — I6523 Occlusion and stenosis of bilateral carotid arteries: Secondary | ICD-10-CM | POA: Diagnosis not present

## 2020-04-26 ENCOUNTER — Ambulatory Visit: Payer: Medicare Other | Admitting: Neurology

## 2020-04-27 DIAGNOSIS — Z0001 Encounter for general adult medical examination with abnormal findings: Secondary | ICD-10-CM | POA: Diagnosis not present

## 2020-04-27 DIAGNOSIS — I129 Hypertensive chronic kidney disease with stage 1 through stage 4 chronic kidney disease, or unspecified chronic kidney disease: Secondary | ICD-10-CM | POA: Diagnosis not present

## 2020-04-27 DIAGNOSIS — J449 Chronic obstructive pulmonary disease, unspecified: Secondary | ICD-10-CM | POA: Diagnosis not present

## 2020-04-27 DIAGNOSIS — G894 Chronic pain syndrome: Secondary | ICD-10-CM | POA: Diagnosis not present

## 2020-04-27 DIAGNOSIS — E782 Mixed hyperlipidemia: Secondary | ICD-10-CM | POA: Diagnosis not present

## 2020-05-11 DIAGNOSIS — J449 Chronic obstructive pulmonary disease, unspecified: Secondary | ICD-10-CM | POA: Diagnosis not present

## 2020-05-11 DIAGNOSIS — R7301 Impaired fasting glucose: Secondary | ICD-10-CM | POA: Diagnosis not present

## 2020-05-11 DIAGNOSIS — I10 Essential (primary) hypertension: Secondary | ICD-10-CM | POA: Diagnosis not present

## 2020-05-11 DIAGNOSIS — E785 Hyperlipidemia, unspecified: Secondary | ICD-10-CM | POA: Diagnosis not present

## 2020-05-18 DIAGNOSIS — J440 Chronic obstructive pulmonary disease with acute lower respiratory infection: Secondary | ICD-10-CM | POA: Diagnosis not present

## 2020-06-08 ENCOUNTER — Other Ambulatory Visit: Payer: Self-pay | Admitting: *Deleted

## 2020-06-08 ENCOUNTER — Telehealth: Payer: Self-pay | Admitting: Hematology and Oncology

## 2020-06-08 DIAGNOSIS — C50412 Malignant neoplasm of upper-outer quadrant of left female breast: Secondary | ICD-10-CM

## 2020-06-08 DIAGNOSIS — Z170 Estrogen receptor positive status [ER+]: Secondary | ICD-10-CM

## 2020-06-08 MED ORDER — ANASTROZOLE 1 MG PO TABS: 1.0000 mg | ORAL_TABLET | Freq: Every day | ORAL | 0 refills | Status: DC

## 2020-06-08 NOTE — Telephone Encounter (Signed)
Scheduled appt per 6/29 sch message - unable to reach pt. Mailed reminder letter with appt date and time

## 2020-06-17 DIAGNOSIS — J440 Chronic obstructive pulmonary disease with acute lower respiratory infection: Secondary | ICD-10-CM | POA: Diagnosis not present

## 2020-07-09 DIAGNOSIS — J449 Chronic obstructive pulmonary disease, unspecified: Secondary | ICD-10-CM | POA: Diagnosis not present

## 2020-07-09 DIAGNOSIS — E785 Hyperlipidemia, unspecified: Secondary | ICD-10-CM | POA: Diagnosis not present

## 2020-07-09 DIAGNOSIS — Z720 Tobacco use: Secondary | ICD-10-CM | POA: Diagnosis not present

## 2020-07-09 DIAGNOSIS — I10 Essential (primary) hypertension: Secondary | ICD-10-CM | POA: Diagnosis not present

## 2020-07-15 DIAGNOSIS — I129 Hypertensive chronic kidney disease with stage 1 through stage 4 chronic kidney disease, or unspecified chronic kidney disease: Secondary | ICD-10-CM | POA: Diagnosis not present

## 2020-07-15 DIAGNOSIS — N182 Chronic kidney disease, stage 2 (mild): Secondary | ICD-10-CM | POA: Diagnosis not present

## 2020-07-15 DIAGNOSIS — Z720 Tobacco use: Secondary | ICD-10-CM | POA: Diagnosis not present

## 2020-07-15 DIAGNOSIS — J449 Chronic obstructive pulmonary disease, unspecified: Secondary | ICD-10-CM | POA: Diagnosis not present

## 2020-07-15 DIAGNOSIS — C50912 Malignant neoplasm of unspecified site of left female breast: Secondary | ICD-10-CM | POA: Diagnosis not present

## 2020-07-18 DIAGNOSIS — J440 Chronic obstructive pulmonary disease with acute lower respiratory infection: Secondary | ICD-10-CM | POA: Diagnosis not present

## 2020-07-22 DIAGNOSIS — C50911 Malignant neoplasm of unspecified site of right female breast: Secondary | ICD-10-CM | POA: Diagnosis not present

## 2020-07-22 DIAGNOSIS — C50912 Malignant neoplasm of unspecified site of left female breast: Secondary | ICD-10-CM | POA: Diagnosis not present

## 2020-08-06 DIAGNOSIS — C50912 Malignant neoplasm of unspecified site of left female breast: Secondary | ICD-10-CM | POA: Diagnosis not present

## 2020-08-06 DIAGNOSIS — C50911 Malignant neoplasm of unspecified site of right female breast: Secondary | ICD-10-CM | POA: Diagnosis not present

## 2020-08-09 DIAGNOSIS — J449 Chronic obstructive pulmonary disease, unspecified: Secondary | ICD-10-CM | POA: Diagnosis not present

## 2020-08-10 ENCOUNTER — Inpatient Hospital Stay: Payer: Medicare Other | Admitting: Hematology and Oncology

## 2020-08-17 ENCOUNTER — Inpatient Hospital Stay: Payer: Medicare Other | Attending: Hematology and Oncology | Admitting: Hematology and Oncology

## 2020-08-17 NOTE — Assessment & Plan Note (Deleted)
Left mastectomy 03/30/2017: IDC 3.2 cm, DCIS, margins negative, 3/18 lymph nodes positive with focal extranodal extension, grade 2, ER 20%, PR 0%, HER-2 negative ratio 1.13, Ki-67 15% stage IIB  Right mastectomy: Complex sclerosing lesion with UDH, PASH, 0/5 LN Neg   Mammaprint Result: High risk: Average 60 year old risk of recurrence untreated is a 29% without systemic chemotherapy Basal type  Adjuvant chemotherapywas recommended butpatient did not want to undergo chemotherapy because she did not want to feel sick or lose her hair.  Treatment plan:Anastrozole 1 mg dailystarted 06/11/2017 Anastrozole toxicities: Denies any adverse effects to anastrozole  Dizziness and lightheadedness: Brain MRI 01/01/2019: Unremarkable   surveillance for breast cancer: Bilateral mastectomies no role of imaging. Breast exam: Benign  Return to clinic in 1 year for follow-up

## 2020-08-18 ENCOUNTER — Telehealth: Payer: Self-pay | Admitting: Hematology and Oncology

## 2020-08-18 DIAGNOSIS — J440 Chronic obstructive pulmonary disease with acute lower respiratory infection: Secondary | ICD-10-CM | POA: Diagnosis not present

## 2020-08-18 NOTE — Telephone Encounter (Signed)
R./s apt per 9/8 sch msg- pt aware of new appt.

## 2020-08-24 DIAGNOSIS — N182 Chronic kidney disease, stage 2 (mild): Secondary | ICD-10-CM | POA: Diagnosis not present

## 2020-08-24 DIAGNOSIS — I129 Hypertensive chronic kidney disease with stage 1 through stage 4 chronic kidney disease, or unspecified chronic kidney disease: Secondary | ICD-10-CM | POA: Diagnosis not present

## 2020-08-24 DIAGNOSIS — Z720 Tobacco use: Secondary | ICD-10-CM | POA: Diagnosis not present

## 2020-08-24 DIAGNOSIS — J449 Chronic obstructive pulmonary disease, unspecified: Secondary | ICD-10-CM | POA: Diagnosis not present

## 2020-09-08 NOTE — Progress Notes (Signed)
TELEPHONE VISIT: Patient could not come in for her appointment today.  She wanted to do a telephone visit.  I verified her identity and proceded to the telephone visit.  Patient Care Team: Pablo Lawrence, NP as PCP - General (Adult Health Nurse Practitioner)  DIAGNOSIS:    ICD-10-CM   1. Malignant neoplasm of upper-outer quadrant of left breast in female, estrogen receptor positive ([place])  C50.412    Z17.0     SUMMARY OF ONCOLOGIC HISTORY: Oncology History  Breast cancer of upper-outer quadrant of left female breast ([place])  01/02/2017 Mammogram   Diagnostic mammogram and US  IMPRESSION: 1. Findings highly suspicious for malignancy in the upper outer quadrant of the left breast, measuring 1.8 x 2.1 x 1.6 cm. Medially adjacent to this mass is a 6 mm oval nearly anechoic nodule which could be a cyst or tumor nodule   2. A suspicious left axillary lymph node. 3. Probable fibroadenoma upper-outer quadrant right breast at 10 o'clock. 4. Probably benign calcifications in the right breast.   01/30/2017 Initial Biopsy   Breast, left, needle core biopsy, upper outer 2:00 9 cm fn - INVASIVE DUCTAL CARCINOMA, SEE COMMENT. - DUCTAL CARCINOMA IN SITU. Microscopic Comment The carcinoma appears grade 2.   01/30/2017 Receptors her2   ER 20%+, weak staining, PR-, HER2-, Ki67 15%   03/30/2017 Surgery   Left mastectomy:  IDC 3.2 cm, DCIS, margins negative, 3/18 lymph nodes positive with focal extranodal extension, grade 2, ER 20%, PR 0%, HER-2 negative ratio 1.13, Ki-67 15% stage IIB Right mastectomy: Complex sclerosing lesion with UDH, PASH, 0/5 LN Neg    04/24/2017 Pathology Results   Mammaprint testing: High risk, 10 year risk of recurrence untreated 29%; 5 year distant metastases free interval 94.6% with came on hormonal therapy, basal type    Chemotherapy   Refused chemo   06/11/2017 -  Anti-estrogen oral therapy   Anastrozole 1 mg daily     CHIEF COMPLIANT: Follow-up of left breast  cancer on anastrozole therapy  INTERVAL HISTORY: Margaret James is a 59 y.o. with above-mentioned history of high risk left breast cancer treated with the bilateral mastectomies, declined chemotherapy, and is currently on antiestrogen therapy with anastrozole. I last saw her 1.5 years ago. She presents to the clinic today for follow-up. Chest pains intermittently. Shes doing well from the breast cancer stand point.  ALLERGIES:  is allergic to lyrica [pregabalin].  MEDICATIONS:  Current Outpatient Medications  Medication Sig Dispense Refill  . albuterol (PROVENTIL HFA;VENTOLIN HFA) 108 (90 Base) MCG/ACT inhaler Inhale 2 puffs into the lungs every 6 (six) hours as needed for wheezing or shortness of breath.    . ALPRAZolam (XANAX) 0.5 MG tablet Take 0.5 mg by mouth daily as needed for anxiety.    Marland Kitchen amLODipine (NORVASC) 5 MG tablet Take 1 tablet (5 mg total) by mouth daily. 30 tablet 0  . anastrozole (ARIMIDEX) 1 MG tablet Take 1 tablet (1 mg total) by mouth daily. 90 tablet 0  . atorvastatin (LIPITOR) 20 MG tablet Take 1 tablet (20 mg total) by mouth daily.    Marland Kitchen buPROPion (WELLBUTRIN SR) 200 MG 12 hr tablet Take 200 mg by mouth daily.    . cloNIDine (CATAPRES) 0.1 MG tablet     . DULoxetine (CYMBALTA) 30 MG capsule Take 30 mg by mouth daily.    Marland Kitchen oxyCODONE-acetaminophen (PERCOCET) 10-325 MG tablet     . Tiotropium Bromide-Olodaterol (STIOLTO RESPIMAT) 2.5-2.5 MCG/ACT AERS Inhale 2 puffs into the lungs daily.    Marland Kitchen  tiZANidine (ZANAFLEX) 4 MG tablet Take 4 mg by mouth every 8 (eight) hours as needed for muscle spasms.      No current facility-administered medications for this visit.    PHYSICAL EXAMINATION: ECOG PERFORMANCE STATUS: 1 - Symptomatic but completely ambulatory     LABORATORY DATA:  I have reviewed the data as listed CMP Latest Ref Rng & Units 01/01/2019 07/31/2018 09/11/2017  Glucose 70 - 99 mg/dL - 107(H) 100  BUN 6 - 20 mg/dL - 10 15.5  Creatinine 0.44 - 1.00 mg/dL 0.80  0.94 1.0  Sodium 135 - 145 mmol/L - 139 139  Potassium 3.5 - 5.1 mmol/L - 4.0 4.3  Chloride 98 - 111 mmol/L - 102 -  CO2 22 - 32 mmol/L - 26 26  Calcium 8.9 - 10.3 mg/dL - 9.6 10.0  Total Protein 6.4 - 8.3 g/dL - - 8.2  Total Bilirubin 0.20 - 1.20 mg/dL - - 0.38  Alkaline Phos 40 - 150 U/L - - 66  AST 5 - 34 U/L - - 15  ALT 0 - 55 U/L - - 18    Lab Results  Component Value Date   WBC 7.2 07/31/2018   HGB 14.5 07/31/2018   HCT 44.7 07/31/2018   MCV 94.7 07/31/2018   PLT 326 07/31/2018   NEUTROABS 4.1 09/11/2017    ASSESSMENT & PLAN:  Breast cancer of upper-outer quadrant of left female breast ([place]) Left mastectomy 03/30/2017: IDC 3.2 cm, DCIS, margins negative, 3/18 lymph nodes positive with focal extranodal extension, grade 2, ER 20%, PR 0%, HER-2 negative ratio 1.13, Ki-67 15% stage IIB  Right mastectomy: Complex sclerosing lesion with UDH, PASH, 0/5 LN Neg   Mammaprint Result: High risk: Average 60 year old risk of recurrence untreated is a 29% without systemic chemotherapy Basal type  Adjuvant chemotherapywas recommended butpatient did not want to undergo chemotherapy because she did not want to feel sick or lose her hair.  Treatment plan:Anastrozole 1 mg dailystarted 06/11/2017 Anastrozole toxicities: She does have chronic arthralgias and myalgias.  Scans: No evidence of cancer Dizziness and lightheadedness: Brain MRI 01/01/2019: Unremarkable Takes Meclizine as needed. Improved.  Surveillance for breast cancer: Bilateral mastectomies no role of imaging. Lesion on the left upper lip area: I encouraged her to see a dermatologist to have it taken out. Skin cancers: on legs and back. Planning on seeing dermatology.  Return to clinic in 1 year for follow-up     No orders of the defined types were placed in this encounter.  The patient has a good understanding of the overall plan. she agrees with it. she will call with any problems that may develop before the  next visit here.  Total time spent: 15 mins including face to face time and time spent for planning, charting and coordination of care  Nicholas Lose, MD 09/09/2020  I, Cloyde Reams Dorshimer, am acting as scribe for Dr. Nicholas Lose.  I have reviewed the above documentation for accuracy and completeness, and I agree with the above.

## 2020-09-09 ENCOUNTER — Inpatient Hospital Stay (HOSPITAL_BASED_OUTPATIENT_CLINIC_OR_DEPARTMENT_OTHER): Payer: Medicare Other | Admitting: Hematology and Oncology

## 2020-09-09 DIAGNOSIS — C50412 Malignant neoplasm of upper-outer quadrant of left female breast: Secondary | ICD-10-CM

## 2020-09-09 DIAGNOSIS — Z170 Estrogen receptor positive status [ER+]: Secondary | ICD-10-CM | POA: Diagnosis not present

## 2020-09-09 DIAGNOSIS — J449 Chronic obstructive pulmonary disease, unspecified: Secondary | ICD-10-CM | POA: Diagnosis not present

## 2020-09-09 MED ORDER — ANASTROZOLE 1 MG PO TABS: 1.0000 mg | ORAL_TABLET | Freq: Every day | ORAL | 3 refills | Status: DC

## 2020-09-09 NOTE — Assessment & Plan Note (Signed)
Left mastectomy 03/30/2017: IDC 3.2 cm, DCIS, margins negative, 3/18 lymph nodes positive with focal extranodal extension, grade 2, ER 20%, PR 0%, HER-2 negative ratio 1.13, Ki-67 15% stage IIB  Right mastectomy: Complex sclerosing lesion with UDH, PASH, 0/5 LN Neg   Mammaprint Result: High risk: Average 60 year old risk of recurrence untreated is a 29% without systemic chemotherapy Basal type  Adjuvant chemotherapywas recommended butpatient did not want to undergo chemotherapy because she did not want to feel sick or lose her hair.  Treatment plan:Anastrozole 1 mg dailystarted 06/11/2017 Anastrozole toxicities: She does have chronic arthralgias and myalgias.  Scans: No evidence of cancer Dizziness and lightheadedness: Brain MRI 01/01/2019: Unremarkable  Surveillance for breast cancer: Bilateral mastectomies no role of imaging. Lesion on the left upper lip area: I encouraged her to see a dermatologist to have it taken out.  Return to clinic in 1 year for follow-up

## 2020-09-10 ENCOUNTER — Telehealth: Payer: Self-pay | Admitting: Hematology and Oncology

## 2020-09-10 NOTE — Telephone Encounter (Signed)
Scheduled appts per 9/30 los. Pt confirmed appt date and time.

## 2020-09-10 DEATH — deceased

## 2020-09-17 DIAGNOSIS — J440 Chronic obstructive pulmonary disease with acute lower respiratory infection: Secondary | ICD-10-CM | POA: Diagnosis not present

## 2020-09-21 DIAGNOSIS — I129 Hypertensive chronic kidney disease with stage 1 through stage 4 chronic kidney disease, or unspecified chronic kidney disease: Secondary | ICD-10-CM | POA: Diagnosis not present

## 2020-09-21 DIAGNOSIS — Z720 Tobacco use: Secondary | ICD-10-CM | POA: Diagnosis not present

## 2020-09-21 DIAGNOSIS — J449 Chronic obstructive pulmonary disease, unspecified: Secondary | ICD-10-CM | POA: Diagnosis not present

## 2020-09-21 DIAGNOSIS — N182 Chronic kidney disease, stage 2 (mild): Secondary | ICD-10-CM | POA: Diagnosis not present

## 2020-10-09 DIAGNOSIS — J449 Chronic obstructive pulmonary disease, unspecified: Secondary | ICD-10-CM | POA: Diagnosis not present

## 2020-10-18 DIAGNOSIS — J440 Chronic obstructive pulmonary disease with acute lower respiratory infection: Secondary | ICD-10-CM | POA: Diagnosis not present

## 2020-10-28 DIAGNOSIS — Z79891 Long term (current) use of opiate analgesic: Secondary | ICD-10-CM | POA: Diagnosis not present

## 2020-10-28 DIAGNOSIS — Z0000 Encounter for general adult medical examination without abnormal findings: Secondary | ICD-10-CM | POA: Diagnosis not present

## 2020-10-28 DIAGNOSIS — Z9981 Dependence on supplemental oxygen: Secondary | ICD-10-CM | POA: Diagnosis not present

## 2020-10-28 DIAGNOSIS — Z720 Tobacco use: Secondary | ICD-10-CM | POA: Diagnosis not present

## 2020-10-28 DIAGNOSIS — R079 Chest pain, unspecified: Secondary | ICD-10-CM | POA: Diagnosis not present

## 2020-11-01 DIAGNOSIS — I10 Essential (primary) hypertension: Secondary | ICD-10-CM | POA: Diagnosis not present

## 2020-11-01 DIAGNOSIS — R7301 Impaired fasting glucose: Secondary | ICD-10-CM | POA: Diagnosis not present

## 2020-11-01 DIAGNOSIS — J449 Chronic obstructive pulmonary disease, unspecified: Secondary | ICD-10-CM | POA: Diagnosis not present

## 2020-11-01 DIAGNOSIS — E785 Hyperlipidemia, unspecified: Secondary | ICD-10-CM | POA: Diagnosis not present

## 2020-11-02 DIAGNOSIS — G894 Chronic pain syndrome: Secondary | ICD-10-CM | POA: Diagnosis not present

## 2020-11-02 DIAGNOSIS — E782 Mixed hyperlipidemia: Secondary | ICD-10-CM | POA: Diagnosis not present

## 2020-11-02 DIAGNOSIS — J449 Chronic obstructive pulmonary disease, unspecified: Secondary | ICD-10-CM | POA: Diagnosis not present

## 2020-11-02 DIAGNOSIS — I129 Hypertensive chronic kidney disease with stage 1 through stage 4 chronic kidney disease, or unspecified chronic kidney disease: Secondary | ICD-10-CM | POA: Diagnosis not present

## 2020-11-09 DIAGNOSIS — J449 Chronic obstructive pulmonary disease, unspecified: Secondary | ICD-10-CM | POA: Diagnosis not present

## 2020-11-15 DIAGNOSIS — J449 Chronic obstructive pulmonary disease, unspecified: Secondary | ICD-10-CM | POA: Diagnosis not present

## 2020-11-17 DIAGNOSIS — J440 Chronic obstructive pulmonary disease with acute lower respiratory infection: Secondary | ICD-10-CM | POA: Diagnosis not present

## 2020-12-09 DIAGNOSIS — J449 Chronic obstructive pulmonary disease, unspecified: Secondary | ICD-10-CM | POA: Diagnosis not present

## 2020-12-10 DIAGNOSIS — E782 Mixed hyperlipidemia: Secondary | ICD-10-CM | POA: Diagnosis not present

## 2020-12-10 DIAGNOSIS — R7301 Impaired fasting glucose: Secondary | ICD-10-CM | POA: Diagnosis not present

## 2020-12-10 DIAGNOSIS — E785 Hyperlipidemia, unspecified: Secondary | ICD-10-CM | POA: Diagnosis not present

## 2020-12-10 DIAGNOSIS — I10 Essential (primary) hypertension: Secondary | ICD-10-CM | POA: Diagnosis not present

## 2020-12-18 DIAGNOSIS — J440 Chronic obstructive pulmonary disease with acute lower respiratory infection: Secondary | ICD-10-CM | POA: Diagnosis not present

## 2020-12-31 DIAGNOSIS — J449 Chronic obstructive pulmonary disease, unspecified: Secondary | ICD-10-CM | POA: Diagnosis not present

## 2021-01-08 DIAGNOSIS — E782 Mixed hyperlipidemia: Secondary | ICD-10-CM | POA: Diagnosis not present

## 2021-01-08 DIAGNOSIS — R7301 Impaired fasting glucose: Secondary | ICD-10-CM | POA: Diagnosis not present

## 2021-01-08 DIAGNOSIS — E785 Hyperlipidemia, unspecified: Secondary | ICD-10-CM | POA: Diagnosis not present

## 2021-01-08 DIAGNOSIS — I10 Essential (primary) hypertension: Secondary | ICD-10-CM | POA: Diagnosis not present

## 2021-01-08 DIAGNOSIS — J449 Chronic obstructive pulmonary disease, unspecified: Secondary | ICD-10-CM | POA: Diagnosis not present

## 2021-01-09 DIAGNOSIS — J449 Chronic obstructive pulmonary disease, unspecified: Secondary | ICD-10-CM | POA: Diagnosis not present

## 2021-01-18 DIAGNOSIS — J440 Chronic obstructive pulmonary disease with acute lower respiratory infection: Secondary | ICD-10-CM | POA: Diagnosis not present

## 2021-01-24 DIAGNOSIS — J449 Chronic obstructive pulmonary disease, unspecified: Secondary | ICD-10-CM | POA: Diagnosis not present

## 2021-02-15 DIAGNOSIS — J440 Chronic obstructive pulmonary disease with acute lower respiratory infection: Secondary | ICD-10-CM | POA: Diagnosis not present

## 2021-02-18 DIAGNOSIS — G894 Chronic pain syndrome: Secondary | ICD-10-CM | POA: Diagnosis not present

## 2021-02-18 DIAGNOSIS — M545 Low back pain, unspecified: Secondary | ICD-10-CM | POA: Diagnosis not present

## 2021-02-18 DIAGNOSIS — M8949 Other hypertrophic osteoarthropathy, multiple sites: Secondary | ICD-10-CM | POA: Diagnosis not present

## 2021-02-18 DIAGNOSIS — G8929 Other chronic pain: Secondary | ICD-10-CM | POA: Diagnosis not present

## 2021-02-28 DIAGNOSIS — M545 Low back pain, unspecified: Secondary | ICD-10-CM | POA: Diagnosis not present

## 2021-02-28 DIAGNOSIS — Z122 Encounter for screening for malignant neoplasm of respiratory organs: Secondary | ICD-10-CM | POA: Diagnosis not present

## 2021-02-28 DIAGNOSIS — G894 Chronic pain syndrome: Secondary | ICD-10-CM | POA: Diagnosis not present

## 2021-02-28 DIAGNOSIS — Z0000 Encounter for general adult medical examination without abnormal findings: Secondary | ICD-10-CM | POA: Diagnosis not present

## 2021-02-28 DIAGNOSIS — Z1212 Encounter for screening for malignant neoplasm of rectum: Secondary | ICD-10-CM | POA: Diagnosis not present

## 2021-02-28 DIAGNOSIS — F17210 Nicotine dependence, cigarettes, uncomplicated: Secondary | ICD-10-CM | POA: Diagnosis not present

## 2021-02-28 DIAGNOSIS — C44509 Unspecified malignant neoplasm of skin of other part of trunk: Secondary | ICD-10-CM | POA: Diagnosis not present

## 2021-02-28 DIAGNOSIS — M8949 Other hypertrophic osteoarthropathy, multiple sites: Secondary | ICD-10-CM | POA: Diagnosis not present

## 2021-02-28 DIAGNOSIS — Z1211 Encounter for screening for malignant neoplasm of colon: Secondary | ICD-10-CM | POA: Diagnosis not present

## 2021-03-14 DIAGNOSIS — Z1211 Encounter for screening for malignant neoplasm of colon: Secondary | ICD-10-CM | POA: Diagnosis not present

## 2021-03-14 DIAGNOSIS — Z1212 Encounter for screening for malignant neoplasm of rectum: Secondary | ICD-10-CM | POA: Diagnosis not present

## 2021-03-18 DIAGNOSIS — J440 Chronic obstructive pulmonary disease with acute lower respiratory infection: Secondary | ICD-10-CM | POA: Diagnosis not present

## 2021-03-31 DIAGNOSIS — F17210 Nicotine dependence, cigarettes, uncomplicated: Secondary | ICD-10-CM | POA: Diagnosis not present

## 2021-04-17 DIAGNOSIS — J440 Chronic obstructive pulmonary disease with acute lower respiratory infection: Secondary | ICD-10-CM | POA: Diagnosis not present

## 2021-05-18 DIAGNOSIS — J440 Chronic obstructive pulmonary disease with acute lower respiratory infection: Secondary | ICD-10-CM | POA: Diagnosis not present

## 2021-06-17 DIAGNOSIS — J440 Chronic obstructive pulmonary disease with acute lower respiratory infection: Secondary | ICD-10-CM | POA: Diagnosis not present

## 2021-07-18 DIAGNOSIS — J440 Chronic obstructive pulmonary disease with acute lower respiratory infection: Secondary | ICD-10-CM | POA: Diagnosis not present

## 2021-08-09 ENCOUNTER — Other Ambulatory Visit (HOSPITAL_COMMUNITY): Payer: Self-pay | Admitting: Adult Health Nurse Practitioner

## 2021-08-09 DIAGNOSIS — R0602 Shortness of breath: Secondary | ICD-10-CM

## 2021-08-18 DIAGNOSIS — J440 Chronic obstructive pulmonary disease with acute lower respiratory infection: Secondary | ICD-10-CM | POA: Diagnosis not present

## 2021-08-22 ENCOUNTER — Ambulatory Visit (HOSPITAL_COMMUNITY)
Admission: RE | Admit: 2021-08-22 | Discharge: 2021-08-22 | Disposition: A | Discharge status: Home / Self Care | Payer: Medicare HMO | Source: Ambulatory Visit | Attending: Adult Health Nurse Practitioner | Admitting: Adult Health Nurse Practitioner

## 2021-08-22 ENCOUNTER — Other Ambulatory Visit: Payer: Self-pay

## 2021-08-22 DIAGNOSIS — R0602 Shortness of breath: Secondary | ICD-10-CM | POA: Insufficient documentation

## 2021-08-22 DIAGNOSIS — J439 Emphysema, unspecified: Secondary | ICD-10-CM | POA: Diagnosis not present

## 2021-08-24 DIAGNOSIS — E782 Mixed hyperlipidemia: Secondary | ICD-10-CM | POA: Diagnosis not present

## 2021-08-24 DIAGNOSIS — E559 Vitamin D deficiency, unspecified: Secondary | ICD-10-CM | POA: Diagnosis not present

## 2021-08-24 DIAGNOSIS — Z853 Personal history of malignant neoplasm of breast: Secondary | ICD-10-CM | POA: Diagnosis not present

## 2021-08-24 DIAGNOSIS — J438 Other emphysema: Secondary | ICD-10-CM | POA: Diagnosis not present

## 2021-08-24 DIAGNOSIS — Z23 Encounter for immunization: Secondary | ICD-10-CM | POA: Diagnosis not present

## 2021-08-24 DIAGNOSIS — E669 Obesity, unspecified: Secondary | ICD-10-CM | POA: Diagnosis not present

## 2021-08-24 DIAGNOSIS — G894 Chronic pain syndrome: Secondary | ICD-10-CM | POA: Diagnosis not present

## 2021-08-24 DIAGNOSIS — I10 Essential (primary) hypertension: Secondary | ICD-10-CM | POA: Diagnosis not present

## 2021-08-24 DIAGNOSIS — Z0000 Encounter for general adult medical examination without abnormal findings: Secondary | ICD-10-CM | POA: Diagnosis not present

## 2021-09-08 ENCOUNTER — Inpatient Hospital Stay: Payer: Medicare HMO | Admitting: Hematology and Oncology

## 2021-09-12 ENCOUNTER — Inpatient Hospital Stay: Payer: Medicare HMO | Attending: Hematology and Oncology | Admitting: Hematology and Oncology

## 2021-09-12 NOTE — Assessment & Plan Note (Deleted)
Left mastectomy 03/30/2017: IDC 3.2 cm, DCIS, margins negative, 3/18 lymph nodes positive with focal extranodal extension, grade 2, ER 20%, PR 0%, HER-2 negative ratio 1.13, Ki-67 15% stage IIB  Right mastectomy: Complex sclerosing lesion with UDH, PASH, 0/5 LN Neg   Mammaprint Result: High risk: Average 61 year old risk of recurrence untreated is a 29% without systemic chemotherapy Basal type  Adjuvant chemotherapywas recommended butpatient did not want to undergo chemotherapy because she did not want to feel sick or lose her hair.  Treatment plan:Anastrozole 1 mg dailystarted 06/11/2017 Anastrozole toxicities: She does have chronic arthralgias and myalgias.  Scans: No evidence of cancer Dizziness and lightheadedness: Brain MRI 01/01/2019: Unremarkable Takes Meclizine as needed. Improved.  Surveillance for breast cancer:  Bilateral mastectomies no role of imaging. Breast Exam: 09/12/21: Benign  Skin cancers: on legs and back. Planning on seeing dermatology.  Return to clinic in 1 year for follow-up

## 2021-09-17 DIAGNOSIS — J440 Chronic obstructive pulmonary disease with acute lower respiratory infection: Secondary | ICD-10-CM | POA: Diagnosis not present

## 2022-05-17 ENCOUNTER — Telehealth: Payer: Self-pay | Admitting: Hematology and Oncology

## 2022-05-17 NOTE — Telephone Encounter (Signed)
Rescheduled appointment per provider PAL. Patient is aware of the changes made to her upcoming appointment. 

## 2022-06-14 ENCOUNTER — Ambulatory Visit: Payer: Medicare HMO | Admitting: Hematology and Oncology

## 2022-06-29 ENCOUNTER — Inpatient Hospital Stay: Payer: Medicare HMO | Attending: Hematology and Oncology | Admitting: Hematology and Oncology

## 2022-06-29 DIAGNOSIS — C50412 Malignant neoplasm of upper-outer quadrant of left female breast: Secondary | ICD-10-CM

## 2022-06-29 DIAGNOSIS — Z170 Estrogen receptor positive status [ER+]: Secondary | ICD-10-CM

## 2022-06-29 MED ORDER — ANASTROZOLE 1 MG PO TABS: 1.0000 mg | ORAL_TABLET | Freq: Every day | ORAL | 3 refills | Status: DC

## 2022-06-29 NOTE — Progress Notes (Signed)
HEMATOLOGY-ONCOLOGY TELEPHONE VISIT PROGRESS NOTE  I connected with our patient on 06/29/22 at 11:15 AM EDT by telephone and verified that I am speaking with the correct person using two identifiers.  I discussed the limitations, risks, security and privacy concerns of performing an evaluation and management service by telephone and the availability of in person appointments.  I also discussed with the patient that there may be a patient responsible charge related to this service. The patient expressed understanding and agreed to proceed.   History of Present Illness: Margaret James is a 61 y.o. with above-mentioned history of high risk left breast cancer. She presents to the clinic today for a telephone follow-up.  Her major complaint is diffuse bone pain especially in the ribs.  She has had multiple skin cancers removed.  She is tolerating anastrozole reasonably well.  She does not have hot flashes.  She does have diffuse muscle aches and pains.  Oncology History  Breast cancer of upper-outer quadrant of left female breast ([place])  01/02/2017 Mammogram   Diagnostic mammogram and US  IMPRESSION: 1. Findings highly suspicious for malignancy in the upper outer quadrant of the left breast, measuring 1.8 x 2.1 x 1.6 cm. Medially adjacent to this mass is a 6 mm oval nearly anechoic nodule which could be a cyst or tumor nodule   2. A suspicious left axillary lymph node. 3. Probable fibroadenoma upper-outer quadrant right breast at 10 o'clock. 4. Probably benign calcifications in the right breast.   01/30/2017 Initial Biopsy   Breast, left, needle core biopsy, upper outer 2:00 9 cm fn - INVASIVE DUCTAL CARCINOMA, SEE COMMENT. - DUCTAL CARCINOMA IN SITU. Microscopic Comment The carcinoma appears grade 2.   01/30/2017 Receptors her2   ER 20%+, weak staining, PR-, HER2-, Ki67 15%   03/30/2017 Surgery   Left mastectomy:  IDC 3.2 cm, DCIS, margins negative, 3/18 lymph nodes positive with focal  extranodal extension, grade 2, ER 20%, PR 0%, HER-2 negative ratio 1.13, Ki-67 15% stage IIB Right mastectomy: Complex sclerosing lesion with UDH, PASH, 0/5 LN Neg    04/24/2017 Pathology Results   Mammaprint testing: High risk, 10 year risk of recurrence untreated 29%; 5 year distant metastases free interval 94.6% with came on hormonal therapy, basal type    Chemotherapy   Refused chemo   06/11/2017 -  Anti-estrogen oral therapy   Anastrozole 1 mg daily     REVIEW OF SYSTEMS:   Constitutional: Denies fevers, chills or abnormal weight loss All other systems were reviewed with the patient and are negative. Observations/Objective:     Assessment Plan:  Breast cancer of upper-outer quadrant of left female breast ([place]) Left mastectomy 03/30/2017:  IDC 3.2 cm, DCIS, margins negative, 3/18 lymph nodes positive with focal extranodal extension, grade 2, ER 20%, PR 0%, HER-2 negative ratio 1.13, Ki-67 15% stage IIB  Right mastectomy: Complex sclerosing lesion with UDH, PASH, 0/5 LN Neg    Mammaprint Result: High risk: Average 62 year old risk of recurrence untreated is a 29% without systemic chemotherapy Basal type   Adjuvant chemotherapy was recommended but patient did not want to undergo chemotherapy because she did not want to feel sick or lose her hair.   Treatment plan: Anastrozole 1 mg daily started 06/11/2017 Anastrozole toxicities: She does have chronic arthralgias and myalgias.   Scans: No evidence of cancer Dizziness and lightheadedness: Brain MRI 01/01/2019: Unremarkable Takes Meclizine as needed. Improved.  Fatigue: On B 12 injections with her PCP   Surveillance for breast cancer:  1. Bilateral mastectomies no role of imaging.  Lesion on the left upper lip area: removed Skin cancers: on legs and back. sees dermatology.   On home Oxygen  Bone pains in ribs: Bone scan in 1 month. Telephone visit 2 days after scan   I discussed the assessment and treatment plan with the  patient. The patient was provided an opportunity to ask questions and all were answered. The patient agreed with the plan and demonstrated an understanding of the instructions. The patient was advised to call back or seek an in-person evaluation if the symptoms worsen or if the condition fails to improve as anticipated.   I provided .vgsc minutes of non-face-to-face time during this encounter.  This includes time for charting and coordination of care   Harriette Ohara, MD   I Gardiner Coins am scribing for Dr. Lindi Adie  I have reviewed the above documentation for accuracy and completeness, and I agree with the above.

## 2022-06-29 NOTE — Assessment & Plan Note (Addendum)
Left mastectomy 03/30/2017: IDC 3.2 cm, DCIS, margins negative, 3/18 lymph nodes positive with focal extranodal extension, grade 2, ER 20%, PR 0%, HER-2 negative ratio 1.13, Ki-67 15% stage IIB  Right mastectomy: Complex sclerosing lesion with UDH, PASH, 0/5 LN Neg   Mammaprint Result: High risk: Average 62 year old risk of recurrence untreated is a 29% without systemic chemotherapy Basal type  Adjuvant chemotherapywas recommended butpatient did not want to undergo chemotherapy because she did not want to feel sick or lose her hair.  Treatment plan:Anastrozole 1 mg dailystarted 06/11/2017 Anastrozole toxicities: She does have chronic arthralgias and myalgias.  Scans: No evidence of cancer Dizziness and lightheadedness: Brain MRI 01/01/2019: Unremarkable Takes Meclizine as needed. Improved.  Fatigue: On B 12 injections with her PCP  Surveillance for breast cancer:  1. Bilateral mastectomies no role of imaging.  Lesion on the left upper lip area: removed Skin cancers: on legs and back. sees dermatology.  On home Oxygen  Bone pains in ribs: Bone scan in 1 month. Telephone visit 2 days after scan

## 2022-06-30 ENCOUNTER — Telehealth: Payer: Self-pay | Admitting: Hematology and Oncology

## 2022-06-30 NOTE — Telephone Encounter (Signed)
Scheduled appointment per 7/20 los. Patient is aware.

## 2022-08-03 ENCOUNTER — Telehealth: Payer: Self-pay | Admitting: Surgery

## 2022-08-03 NOTE — Telephone Encounter (Signed)
Whole body bone scan scheduled at [facility] on 08/07/22 at 10 am.  Pt to arrive 15 min early.  Phone visit with Dr. Lindi Adie rescheduled for 08/10/22 at 8 am.  Pt verified understanding of these appointments.

## 2022-08-07 ENCOUNTER — Ambulatory Visit (HOSPITAL_COMMUNITY): Payer: Medicare HMO

## 2022-08-07 ENCOUNTER — Inpatient Hospital Stay: Payer: Medicare HMO | Admitting: Hematology and Oncology

## 2022-08-10 ENCOUNTER — Inpatient Hospital Stay: Payer: Medicare HMO | Admitting: Hematology and Oncology

## 2022-08-16 ENCOUNTER — Ambulatory Visit (HOSPITAL_COMMUNITY)
Admission: RE | Admit: 2022-08-16 | Discharge: 2022-08-16 | Disposition: A | Discharge status: Home / Self Care | Payer: Medicare HMO | Source: Ambulatory Visit | Attending: Hematology and Oncology | Admitting: Hematology and Oncology

## 2022-08-16 ENCOUNTER — Ambulatory Visit (HOSPITAL_COMMUNITY): Payer: Medicare HMO

## 2022-08-16 DIAGNOSIS — Z170 Estrogen receptor positive status [ER+]: Secondary | ICD-10-CM | POA: Insufficient documentation

## 2022-08-16 DIAGNOSIS — C50412 Malignant neoplasm of upper-outer quadrant of left female breast: Secondary | ICD-10-CM | POA: Diagnosis present

## 2022-08-16 MED ORDER — TECHNETIUM TC 99M MEDRONATE IV KIT
20.0000 | PACK | Freq: Once | INTRAVENOUS | Status: AC | PRN
  Administered 2022-08-16: 19.2 via INTRAVENOUS

## 2022-08-17 NOTE — Progress Notes (Signed)
HEMATOLOGY-ONCOLOGY TELEPHONE VISIT PROGRESS NOTE  I connected with our patient on 08/17/22 at  9:30 AM EDT by telephone and verified that I am speaking with the correct person using two identifiers.  I discussed the limitations, risks, security and privacy concerns of performing an evaluation and management service by telephone and the availability of in person appointments.  I also discussed with the patient that there may be a patient responsible charge related to this service. The patient expressed understanding and agreed to proceed.   History of Present Illness: Margaret James is a 61 y.o. with above-mentioned history of high risk left breast cancer. She presents to the clinic today for a telephone follow-up to discuss bone scan.  Oncology History  Breast cancer of upper-outer quadrant of left female breast ([place])  01/02/2017 Mammogram   Diagnostic mammogram and US  IMPRESSION: 1. Findings highly suspicious for malignancy in the upper outer quadrant of the left breast, measuring 1.8 x 2.1 x 1.6 cm. Medially adjacent to this mass is a 6 mm oval nearly anechoic nodule which could be a cyst or tumor nodule   2. A suspicious left axillary lymph node. 3. Probable fibroadenoma upper-outer quadrant right breast at 10 o'clock. 4. Probably benign calcifications in the right breast.   01/30/2017 Initial Biopsy   Breast, left, needle core biopsy, upper outer 2:00 9 cm fn - INVASIVE DUCTAL CARCINOMA, SEE COMMENT. - DUCTAL CARCINOMA IN SITU. Microscopic Comment The carcinoma appears grade 2.   01/30/2017 Receptors her2   ER 20%+, weak staining, PR-, HER2-, Ki67 15%   03/30/2017 Surgery   Left mastectomy:  IDC 3.2 cm, DCIS, margins negative, 3/18 lymph nodes positive with focal extranodal extension, grade 2, ER 20%, PR 0%, HER-2 negative ratio 1.13, Ki-67 15% stage IIB Right mastectomy: Complex sclerosing lesion with UDH, PASH, 0/5 LN Neg    04/24/2017 Pathology Results   Mammaprint testing:  High risk, 10 year risk of recurrence untreated 29%; 5 year distant metastases free interval 94.6% with came on hormonal therapy, basal type    Chemotherapy   Refused chemo   06/11/2017 -  Anti-estrogen oral therapy   Anastrozole 1 mg daily     REVIEW OF SYSTEMS:   Constitutional: Denies fevers, chills or abnormal weight loss All other systems were reviewed with the patient and are negative. Observations/Objective:     Assessment Plan:  No problem-specific Assessment & Plan notes found for this encounter.    I discussed the assessment and treatment plan with the patient. The patient was provided an opportunity to ask questions and all were answered. The patient agreed with the plan and demonstrated an understanding of the instructions. The patient was advised to call back or seek an in-person evaluation if the symptoms worsen or if the condition fails to improve as anticipated.   I provided *** minutes of non-face-to-face time during this encounter.  This includes time for charting and coordination of care   [place], CMA  I Gardiner Coins am scribing for Dr. Lindi Adie  ***

## 2022-08-21 ENCOUNTER — Inpatient Hospital Stay: Payer: Medicare HMO | Attending: Hematology and Oncology | Admitting: Hematology and Oncology

## 2022-08-21 DIAGNOSIS — M546 Pain in thoracic spine: Secondary | ICD-10-CM | POA: Diagnosis not present

## 2022-08-21 DIAGNOSIS — G8929 Other chronic pain: Secondary | ICD-10-CM

## 2022-08-21 DIAGNOSIS — Z170 Estrogen receptor positive status [ER+]: Secondary | ICD-10-CM | POA: Diagnosis not present

## 2022-08-21 DIAGNOSIS — C50412 Malignant neoplasm of upper-outer quadrant of left female breast: Secondary | ICD-10-CM

## 2022-08-21 NOTE — Assessment & Plan Note (Signed)
Left mastectomy 03/30/2017: IDC 3.2 cm, DCIS, margins negative, 3/18 lymph nodes positive with focal extranodal extension, grade 2, ER 20%, PR 0%, HER-2 negative ratio 1.13, Ki-67 15% stage IIB  Right mastectomy: Complex sclerosing lesion with UDH, PASH, 0/5 LN Neg   Mammaprint Result: High risk: Average 61 year old risk of recurrence untreated is a 29% without systemic chemotherapy Basal type  Adjuvant chemotherapywas recommended butpatient did not want to undergo chemotherapy because she did not want to feel sick or lose her hair.  Treatment plan:Anastrozole 1 mg dailystarted 06/11/2017 Anastrozole toxicities:She does have chronic arthralgias and myalgias.  Scans: No evidence of cancer Dizziness and lightheadedness:Brain MRI 01/01/2019: Unremarkable Takes Meclizine as needed. Improved.  Fatigue: On B 12 injections with her PCP  Surveillance for breast cancer:  1. Bilateral mastectomies no role of imaging.  Lesion on the left upper lip area: removed Skin cancers: on legs and back. sees dermatology.  On home Oxygen  Bone pains in ribs: Bone scan 08/17/2022: Single focus of tracer uptake right paraspinal at T1 or T2 cannot exclude metastases. We will plan to obtain MRI of the thoracic spine for further evaluation.  Telephone visit 2 days after the MRI to discuss results.

## 2022-08-23 ENCOUNTER — Other Ambulatory Visit (HOSPITAL_COMMUNITY): Payer: Medicare HMO

## 2022-09-05 ENCOUNTER — Telehealth: Payer: Self-pay | Admitting: Hematology and Oncology

## 2022-09-05 NOTE — Telephone Encounter (Signed)
Scheduled appointment per 9/26 staff message. Left voicemail.

## 2022-09-11 ENCOUNTER — Ambulatory Visit
Admission: RE | Admit: 2022-09-11 | Discharge: 2022-09-11 | Disposition: A | Discharge status: Home / Self Care | Payer: Medicare HMO | Source: Ambulatory Visit | Attending: Hematology and Oncology | Admitting: Hematology and Oncology

## 2022-09-11 ENCOUNTER — Other Ambulatory Visit: Payer: Self-pay | Admitting: *Deleted

## 2022-09-11 DIAGNOSIS — C50412 Malignant neoplasm of upper-outer quadrant of left female breast: Secondary | ICD-10-CM

## 2022-09-11 DIAGNOSIS — G8929 Other chronic pain: Secondary | ICD-10-CM

## 2022-09-11 NOTE — Progress Notes (Signed)
Received call from pt stating she had a panic attack during MRI thoracic spine today and was not able to tolerate procedure.  Per MD pt needing to take Xanax 0.5 mg tablet p.o 1 hour prior to scan and repeat with second tablet 30 minutes prior to scan.  Pt requesting scan to be preformed at [facility] or [facility].  Orders placed.  Pt also requesting CT CAP per request of PCP for further evaluation of concerns found on recent chest xray.  Verbal orders received from MD to obtain CT CAP for further evaluation.  RN will schedule appt once PA is obtained.  Pt updated on plan and verbalized understanding.

## 2022-09-15 ENCOUNTER — Inpatient Hospital Stay: Payer: Medicare HMO | Admitting: Hematology and Oncology

## 2022-09-15 NOTE — Progress Notes (Signed)
Patient Care Team: Pablo Lawrence, NP as PCP - General (Adult Health Nurse Practitioner)  DIAGNOSIS: No diagnosis found.  SUMMARY OF ONCOLOGIC HISTORY: Oncology History  Breast cancer of upper-outer quadrant of left female breast ([place])  01/02/2017 Mammogram   Diagnostic mammogram and US  IMPRESSION: 1. Findings highly suspicious for malignancy in the upper outer quadrant of the left breast, measuring 1.8 x 2.1 x 1.6 cm. Medially adjacent to this mass is a 6 mm oval nearly anechoic nodule which could be a cyst or tumor nodule   2. A suspicious left axillary lymph node. 3. Probable fibroadenoma upper-outer quadrant right breast at 10 o'clock. 4. Probably benign calcifications in the right breast.   01/30/2017 Initial Biopsy   Breast, left, needle core biopsy, upper outer 2:00 9 cm fn - INVASIVE DUCTAL CARCINOMA, SEE COMMENT. - DUCTAL CARCINOMA IN SITU. Microscopic Comment The carcinoma appears grade 2.   01/30/2017 Receptors her2   ER 20%+, weak staining, PR-, HER2-, Ki67 15%   03/30/2017 Surgery   Left mastectomy:  IDC 3.2 cm, DCIS, margins negative, 3/18 lymph nodes positive with focal extranodal extension, grade 2, ER 20%, PR 0%, HER-2 negative ratio 1.13, Ki-67 15% stage IIB Right mastectomy: Complex sclerosing lesion with UDH, PASH, 0/5 LN Neg    04/24/2017 Pathology Results   Mammaprint testing: High risk, 10 year risk of recurrence untreated 29%; 5 year distant metastases free interval 94.6% with came on hormonal therapy, basal type    Chemotherapy   Refused chemo   06/11/2017 -  Anti-estrogen oral therapy   Anastrozole 1 mg daily     CHIEF COMPLIANT: Follow-up to review scans  INTERVAL HISTORY: Margaret James is a 61 y.o. that had a left mastectomy an currently on anastrozole. She presents to the clinic for a follow-up.    ALLERGIES:  is allergic to lyrica [pregabalin].  MEDICATIONS:  Current Outpatient Medications  Medication Sig Dispense Refill    albuterol (PROVENTIL HFA;VENTOLIN HFA) 108 (90 Base) MCG/ACT inhaler Inhale 2 puffs into the lungs every 6 (six) hours as needed for wheezing or shortness of breath.     ALPRAZolam (XANAX) 0.5 MG tablet Take 0.5 mg by mouth daily as needed for anxiety.     amLODipine (NORVASC) 5 MG tablet Take 1 tablet (5 mg total) by mouth daily. 30 tablet 0   anastrozole (ARIMIDEX) 1 MG tablet Take 1 tablet (1 mg total) by mouth daily. 90 tablet 3   atorvastatin (LIPITOR) 20 MG tablet Take 1 tablet (20 mg total) by mouth daily.     buPROPion (WELLBUTRIN SR) 200 MG 12 hr tablet Take 200 mg by mouth daily.     cloNIDine (CATAPRES) 0.1 MG tablet      DULoxetine (CYMBALTA) 30 MG capsule Take 30 mg by mouth daily.     oxyCODONE-acetaminophen (PERCOCET) 10-325 MG tablet      Tiotropium Bromide-Olodaterol (STIOLTO RESPIMAT) 2.5-2.5 MCG/ACT AERS Inhale 2 puffs into the lungs daily.     tiZANidine (ZANAFLEX) 4 MG tablet Take 4 mg by mouth every 8 (eight) hours as needed for muscle spasms.      No current facility-administered medications for this visit.    PHYSICAL EXAMINATION: ECOG PERFORMANCE STATUS: {CHL ONC ECOG PS:[id number]}  There were no vitals filed for this visit. There were no vitals filed for this visit.  BREAST:*** No palpable masses or nodules in either right or left breasts. No palpable axillary supraclavicular or infraclavicular adenopathy no breast tenderness or nipple discharge. (exam performed in the  presence of a chaperone)  LABORATORY DATA:  I have reviewed the data as listed    Latest Ref Rng & Units 01/01/2019    3:19 PM 07/31/2018    8:53 PM 09/11/2017    1:34 PM  CMP  Glucose 70 - 99 mg/dL  107  100   BUN 6 - 20 mg/dL  10  15.5   Creatinine 0.44 - 1.00 mg/dL 0.80  0.94  1.0   Sodium 135 - 145 mmol/L  139  139   Potassium 3.5 - 5.1 mmol/L  4.0  4.3   Chloride 98 - 111 mmol/L  102    CO2 22 - 32 mmol/L  26  26   Calcium 8.9 - 10.3 mg/dL  9.6  10.0   Total Protein 6.4 - 8.3 g/dL    8.2   Total Bilirubin 0.20 - 1.20 mg/dL   0.38   Alkaline Phos 40 - 150 U/L   66   AST 5 - 34 U/L   15   ALT 0 - 55 U/L   18     Lab Results  Component Value Date   WBC 7.2 07/31/2018   HGB 14.5 07/31/2018   HCT 44.7 07/31/2018   MCV 94.7 07/31/2018   PLT 326 07/31/2018   NEUTROABS 4.1 09/11/2017    ASSESSMENT & PLAN:  No problem-specific Assessment & Plan notes found for this encounter.    No orders of the defined types were placed in this encounter.  The patient has a good understanding of the overall plan. she agrees with it. she will call with any problems that may develop before the next visit here. Total time spent: 30 mins including face to face time and time spent for planning, charting and co-ordination of care   Suzzette Righter, [place] 09/15/22    I Gardiner Coins am scribing for Dr. Lindi Adie  ***

## 2022-09-17 ENCOUNTER — Ambulatory Visit (HOSPITAL_COMMUNITY)
Admission: RE | Admit: 2022-09-17 | Discharge: 2022-09-17 | Disposition: A | Discharge status: Home / Self Care | Payer: Medicare HMO | Source: Ambulatory Visit | Attending: Hematology and Oncology | Admitting: Hematology and Oncology

## 2022-09-17 DIAGNOSIS — Z170 Estrogen receptor positive status [ER+]: Secondary | ICD-10-CM | POA: Diagnosis present

## 2022-09-17 DIAGNOSIS — M546 Pain in thoracic spine: Secondary | ICD-10-CM | POA: Insufficient documentation

## 2022-09-17 DIAGNOSIS — G8929 Other chronic pain: Secondary | ICD-10-CM | POA: Diagnosis present

## 2022-09-17 DIAGNOSIS — C50412 Malignant neoplasm of upper-outer quadrant of left female breast: Secondary | ICD-10-CM | POA: Diagnosis present

## 2022-09-17 MED ORDER — GADOPICLENOL 0.5 MMOL/ML IV SOLN
8.0000 mL | Freq: Once | INTRAVENOUS | Status: AC | PRN
  Administered 2022-09-17: 8 mL via INTRAVENOUS

## 2022-09-17 MED ORDER — IOHEXOL 300 MG/ML  SOLN
100.0000 mL | Freq: Once | INTRAMUSCULAR | Status: AC | PRN
  Administered 2022-09-17: 100 mL via INTRAVENOUS

## 2022-09-20 ENCOUNTER — Other Ambulatory Visit: Payer: Self-pay

## 2022-09-20 ENCOUNTER — Inpatient Hospital Stay: Payer: Medicare HMO | Attending: Hematology and Oncology | Admitting: Hematology and Oncology

## 2022-09-20 VITALS — BP 181/99 | HR 112 | Temp 98.1°F | Resp 16 | Ht 65.0 in | Wt 201.3 lb

## 2022-09-20 DIAGNOSIS — R918 Other nonspecific abnormal finding of lung field: Secondary | ICD-10-CM | POA: Insufficient documentation

## 2022-09-20 DIAGNOSIS — Z79811 Long term (current) use of aromatase inhibitors: Secondary | ICD-10-CM | POA: Insufficient documentation

## 2022-09-20 DIAGNOSIS — Z170 Estrogen receptor positive status [ER+]: Secondary | ICD-10-CM | POA: Diagnosis not present

## 2022-09-20 DIAGNOSIS — R0602 Shortness of breath: Secondary | ICD-10-CM | POA: Diagnosis not present

## 2022-09-20 DIAGNOSIS — Z9981 Dependence on supplemental oxygen: Secondary | ICD-10-CM | POA: Insufficient documentation

## 2022-09-20 DIAGNOSIS — C50412 Malignant neoplasm of upper-outer quadrant of left female breast: Secondary | ICD-10-CM | POA: Insufficient documentation

## 2022-09-20 DIAGNOSIS — Z9013 Acquired absence of bilateral breasts and nipples: Secondary | ICD-10-CM | POA: Insufficient documentation

## 2022-09-20 NOTE — Assessment & Plan Note (Addendum)
Left mastectomy 03/30/2017: IDC 3.2 cm, DCIS, margins negative, 3/18 lymph nodes positive with focal extranodal extension, grade 2, ER 20%, PR 0%, HER-2 negative ratio 1.13, Ki-67 15% stage IIB  Right mastectomy: Complex sclerosing lesion with UDH, PASH, 0/5 LN Neg   Mammaprint Result: High risk: Average 61-year-old risk of recurrence untreated is a 29% without systemic chemotherapy Basal type  Adjuvant chemotherapywas recommended butpatient did not want to undergo chemotherapy because she did not want to feel sick or lose her hair.  Treatment plan:Anastrozole 1 mg dailystarted 06/11/2017 Anastrozole toxicities:She does have chronic arthralgias and myalgias. Dizziness and lightheadedness:Brain MRI 01/01/2019: Unremarkable Takes Meclizine as needed. Improved.  Fatigue: On B 12 injections with her PCP  Surveillance for breast cancer: 1.Bilateral mastectomies no role of imaging.  Lesion on the left upper lip area:removed Skin cancers: on legs and back.seesdermatology.  On home Oxygen  Bone pains in ribs: Bone scan 08/17/2022: Single focus of tracer uptake right paraspinal at T1 or T2 cannot exclude metastases. CT CAP 09/18/2022: Left internal mammary and prevascular nodal metastases as well as lung metastatic disease (small nodules) focal sclerosis T10.  No osseous lesions MRI thoracic spine 09/18/2022: No suspicious bone lesions.  Upper mediastinal mass 2.7 cm o obtain MRI of the thoracic spine for further evaluation.  Reviewed the radiology reports with the patient in great detail.  There is clinical suspicion for metastatic disease.  We discussed different options including adding Verzinio to her current regimen.  Abemaciclib counseling: I discussed at length the risks and benefits of Abemaciclib in combination with letrozole. Adverse effects of Abemaciclib include decreasing neutrophil count, pneumonia, blood clots in lungs as well as nausea and GI symptoms. Side effects  of letrozole include hot flashes, muscle aches and pains, uterine bleeding/spotting/cancer, osteoporosis, risk of blood clots.  We decided to watch and monitor for 2 more months and recheck with another CT chest and follow-up after that. 

## 2022-09-22 ENCOUNTER — Telehealth: Payer: Self-pay | Admitting: Hematology and Oncology

## 2022-09-22 NOTE — Telephone Encounter (Signed)
Contacted patient to scheduled appointments. Left message with appointment details and a call back number if patient had any questions or could not accommodate the time we provided.   

## 2022-11-27 ENCOUNTER — Ambulatory Visit: Payer: Medicare HMO | Admitting: Hematology and Oncology

## 2022-12-20 ENCOUNTER — Encounter (HOSPITAL_BASED_OUTPATIENT_CLINIC_OR_DEPARTMENT_OTHER): Payer: Self-pay

## 2022-12-20 DIAGNOSIS — R0681 Apnea, not elsewhere classified: Secondary | ICD-10-CM

## 2022-12-20 DIAGNOSIS — G4700 Insomnia, unspecified: Secondary | ICD-10-CM

## 2023-06-11 DEATH — deceased

## 2023-10-31 NOTE — Telephone Encounter (Signed)
Telephone call
